# Patient Record
Sex: Female | Born: 1995 | Race: Black or African American | Hispanic: No | Marital: Single | Smoking: Never smoker
Health system: Southern US, Community
[De-identification: ages and names within clinical notes are randomized; demographics above are authoritative.]

## PROBLEM LIST (undated history)

## (undated) ENCOUNTER — Inpatient Hospital Stay (HOSPITAL_COMMUNITY): Payer: Self-pay

## (undated) ENCOUNTER — Ambulatory Visit (HOSPITAL_COMMUNITY): Source: Home / Self Care

## (undated) DIAGNOSIS — F419 Anxiety disorder, unspecified: Secondary | ICD-10-CM

## (undated) DIAGNOSIS — O24419 Gestational diabetes mellitus in pregnancy, unspecified control: Secondary | ICD-10-CM

## (undated) DIAGNOSIS — N390 Urinary tract infection, site not specified: Secondary | ICD-10-CM

## (undated) DIAGNOSIS — A599 Trichomoniasis, unspecified: Secondary | ICD-10-CM

## (undated) DIAGNOSIS — N83209 Unspecified ovarian cyst, unspecified side: Secondary | ICD-10-CM

## (undated) HISTORY — PX: NO PAST SURGERIES: SHX2092

---
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
## 2020-09-08 ENCOUNTER — Emergency Department (HOSPITAL_COMMUNITY)
Admission: EM | Admit: 2020-09-08 | Discharge: 2020-09-09 | Disposition: A | Discharge status: Home / Self Care | Payer: Medicaid Other | Attending: Emergency Medicine | Admitting: Emergency Medicine

## 2020-09-08 ENCOUNTER — Encounter (HOSPITAL_COMMUNITY): Payer: Self-pay

## 2020-09-08 DIAGNOSIS — Z036 Encounter for observation for suspected toxic effect from ingested substance ruled out: Secondary | ICD-10-CM | POA: Diagnosis not present

## 2020-09-08 DIAGNOSIS — Z5321 Procedure and treatment not carried out due to patient leaving prior to being seen by health care provider: Secondary | ICD-10-CM | POA: Insufficient documentation

## 2020-09-08 NOTE — ED Notes (Signed)
Pt up to desk inquiring about the wait time. Pt states that she will not be staying. Seen leaving ED.

## 2020-09-08 NOTE — ED Triage Notes (Signed)
Pt states that she was eating some food today and saw a worm in her food. Denies abd pain or n/v

## 2020-09-08 NOTE — ED Provider Notes (Signed)
Emergency Medicine Provider Triage Evaluation Note  Priscilla Phillips , a 25 y.o. female  was evaluated in triage.  Pt complains of eating food with worms in it.  Pt reports she ate around 7pm.  Pt reports her Mother wanted her to come get checked out.   Review of Systems  Positive:  Negative: No abdominal pain   Physical Exam  BP 126/76   Pulse 85   Temp 98.6 F (37 C) (Oral)   Resp 18   SpO2 100%  Gen:   Awake, no distress   Resp:  Normal effort  MSK:   Moves extremities without difficulty  Other:    Medical Decision Making  Medically screening exam initiated at 9:11 PM.  Appropriate orders placed.  Priscilla Phillips was informed that the remainder of the evaluation will be completed by another provider, this initial triage assessment does not replace that evaluation, and the importance of remaining in the ED until their evaluation is complete.     Priscilla Phillips 09/08/20 2112    Priscilla Sprout, MD 09/11/20 1352

## 2020-11-23 ENCOUNTER — Other Ambulatory Visit: Payer: Self-pay

## 2020-11-23 ENCOUNTER — Ambulatory Visit
Admission: EM | Admit: 2020-11-23 | Discharge: 2020-11-23 | Disposition: A | Discharge status: Home / Self Care | Payer: Medicaid Other | Attending: Physician Assistant | Admitting: Physician Assistant

## 2020-11-23 ENCOUNTER — Encounter: Payer: Self-pay | Admitting: Emergency Medicine

## 2020-11-23 DIAGNOSIS — R1030 Lower abdominal pain, unspecified: Secondary | ICD-10-CM

## 2020-11-23 HISTORY — DX: Unspecified ovarian cyst, unspecified side: N83.209

## 2020-11-23 LAB — POCT URINALYSIS DIP (MANUAL ENTRY)
Bilirubin, UA: NEGATIVE
Glucose, UA: NEGATIVE mg/dL
Leukocytes, UA: NEGATIVE
Nitrite, UA: NEGATIVE
Protein Ur, POC: NEGATIVE mg/dL
Spec Grav, UA: 1.025 (ref 1.010–1.025)
Urobilinogen, UA: 0.2 E.U./dL
pH, UA: 6.5 (ref 5.0–8.0)

## 2020-11-23 LAB — POCT URINE PREGNANCY: Preg Test, Ur: NEGATIVE

## 2020-11-23 NOTE — ED Triage Notes (Signed)
RLQ abdominal pain beginning two days prior following sexual intercourse, continuing into today. Moved into lower back yesterday. Denies dysuria, abnormal vaginal discharge, fever, N/V/D. Reports normal bowel movements. States is possible for her to be pregnant. Hx of ovarian cysts. Reports pain worse with walking. Abdominal tenderness present on palpation

## 2020-11-23 NOTE — ED Provider Notes (Signed)
[facility]    CSN: [id number] Arrival date & time: 11/23/20  1747      History   Chief Complaint Chief Complaint  Patient presents with   Abdominal Pain    HPI ELIM PEALE is a 25 y.o. female.   Pt complains of lower abdominal pain that started two days ago.  She reports some right lower back pain.  She denies dysuria, hematuria, vaginal discharge, pelvic pain, fever, chills, n/v/d. LMP 11/10/20. H/o painful ovarian cyst in the past. She has tried nothing for the sx.    Past Medical History:  Diagnosis Date   Ovarian cyst     There are no problems to display for this patient.   History reviewed. No pertinent surgical history.  OB History   No obstetric history on file.      Home Medications    Prior to Admission medications   Not on File    Family History History reviewed. No pertinent family history.  Social History Social History   Tobacco Use   Smoking status: Some Days    Types: Cigarettes   Smokeless tobacco: Never     Allergies   Patient has no known allergies.   Review of Systems Review of Systems  Constitutional:  Negative for chills and fever.  HENT:  Negative for ear pain and sore throat.   Eyes:  Negative for pain and visual disturbance.  Respiratory:  Negative for cough and shortness of breath.   Cardiovascular:  Negative for chest pain and palpitations.  Gastrointestinal:  Positive for abdominal pain. Negative for constipation, diarrhea, nausea and vomiting.  Genitourinary:  Negative for difficulty urinating, dysuria, hematuria, urgency, vaginal discharge and vaginal pain.  Musculoskeletal:  Negative for arthralgias and back pain.  Skin:  Negative for color change and rash.  Neurological:  Negative for seizures and syncope.  All other systems reviewed and are negative.   Physical Exam Triage Vital Signs ED Triage Vitals  Enc Vitals Group     BP 11/23/20 1839 126/83     Pulse Rate 11/23/20 1839 90      Resp 11/23/20 1839 16     Temp 11/23/20 1839 98.1 F (36.7 C)     Temp Source 11/23/20 1839 Oral     SpO2 11/23/20 1839 97 %     Weight --      Height --      Head Circumference --      Peak Flow --      Pain Score 11/23/20 1846 8     Pain Loc --      Pain Edu? --      Excl. in GC? --    No data found.  Updated Vital Signs BP 126/83 (BP Location: Left Arm)   Pulse 90   Temp 98.1 F (36.7 C) (Oral)   Resp 16   SpO2 97%   Visual Acuity Right Eye Distance:   Left Eye Distance:   Bilateral Distance:    Right Eye Near:   Left Eye Near:    Bilateral Near:     Physical Exam Vitals and nursing note reviewed.  Constitutional:      General: She is not in acute distress.    Appearance: She is well-developed.  HENT:     Head: Normocephalic and atraumatic.  Eyes:     Conjunctiva/sclera: Conjunctivae normal.  Cardiovascular:     Rate and Rhythm: Normal rate and regular rhythm.     Heart sounds: No murmur heard.  Pulmonary:     Effort: Pulmonary effort is normal. No respiratory distress.     Breath sounds: Normal breath sounds.  Abdominal:     Palpations: Abdomen is soft.     Tenderness: There is abdominal tenderness in the suprapubic area. There is no right CVA tenderness or left CVA tenderness. Negative signs include Murphy's sign and McBurney's sign.  Musculoskeletal:     Cervical back: Neck supple.  Skin:    General: Skin is warm and dry.  Neurological:     Mental Status: She is alert.     UC Treatments / Results  Labs (all labs ordered are listed, but only abnormal results are displayed) Labs Reviewed  POCT URINALYSIS DIP (MANUAL ENTRY) - Abnormal; Notable for the following components:      Result Value   Clarity, UA hazy (*)    Ketones, POC UA >= (160) (*)    Blood, UA trace-intact (*)    All other components within normal limits  POCT URINE PREGNANCY    EKG   Radiology No results found.  Procedures Procedures (including critical care  time)  Medications Ordered in UC Medications - No data to display  Initial Impression / Assessment and Plan / UC Course  I have reviewed the triage vital signs and the nursing notes.  Pertinent labs & imaging results that were available during my care of the patient were reviewed by me and considered in my medical decision making (see chart for details).     Pt with mild tenderness to deep palpation to suprapubic area, no RLQ tenderness on exam, no rebound tenderness, no CVA tenderness.  Vitals wnl, no n/v.  Stable for discharge with close monitoring at home.  Advised to go to the emergency department if sx become worse or if she develops n/v fever.  Final Clinical Impressions(s) / UC Diagnoses   Final diagnoses:  Lower abdominal pain     Discharge Instructions      Drink plenty of fluids Take ibuprofen as needed for pain If symptoms worsen go to the emergency department for further evaluation   ED Prescriptions   None    PDMP not reviewed this encounter.   Ward, Tylene Fantasia, PA-C 11/24/20 0930

## 2020-11-23 NOTE — Discharge Instructions (Addendum)
Drink plenty of fluids Take ibuprofen as needed for pain If symptoms worsen go to the emergency department for further evaluation

## 2020-12-07 ENCOUNTER — Inpatient Hospital Stay (HOSPITAL_COMMUNITY)
Admission: AD | Admit: 2020-12-07 | Discharge: 2020-12-07 | Disposition: A | Discharge status: Home / Self Care | Payer: Medicaid Other | Attending: Obstetrics and Gynecology | Admitting: Obstetrics and Gynecology

## 2020-12-07 ENCOUNTER — Encounter (HOSPITAL_COMMUNITY): Payer: Self-pay | Admitting: Obstetrics and Gynecology

## 2020-12-07 ENCOUNTER — Inpatient Hospital Stay (HOSPITAL_COMMUNITY): Payer: Medicaid Other

## 2020-12-07 ENCOUNTER — Other Ambulatory Visit: Payer: Self-pay

## 2020-12-07 DIAGNOSIS — O3680X0 Pregnancy with inconclusive fetal viability, not applicable or unspecified: Secondary | ICD-10-CM | POA: Diagnosis not present

## 2020-12-07 DIAGNOSIS — Z3A01 Less than 8 weeks gestation of pregnancy: Secondary | ICD-10-CM | POA: Diagnosis not present

## 2020-12-07 DIAGNOSIS — O26891 Other specified pregnancy related conditions, first trimester: Secondary | ICD-10-CM | POA: Diagnosis not present

## 2020-12-07 DIAGNOSIS — R102 Pelvic and perineal pain: Secondary | ICD-10-CM | POA: Insufficient documentation

## 2020-12-07 DIAGNOSIS — Z6790 Unspecified blood type, Rh positive: Secondary | ICD-10-CM

## 2020-12-07 HISTORY — DX: Anxiety disorder, unspecified: F41.9

## 2020-12-07 HISTORY — DX: Trichomoniasis, unspecified: A59.9

## 2020-12-07 HISTORY — DX: Urinary tract infection, site not specified: N39.0

## 2020-12-07 LAB — COMPREHENSIVE METABOLIC PANEL
ALT: 12 U/L (ref 0–44)
AST: 16 U/L (ref 15–41)
Albumin: 4.4 g/dL (ref 3.5–5.0)
Alkaline Phosphatase: 37 U/L — ABNORMAL LOW (ref 38–126)
Anion gap: 12 (ref 5–15)
BUN: 8 mg/dL (ref 6–20)
CO2: 21 mmol/L — ABNORMAL LOW (ref 22–32)
Calcium: 9.6 mg/dL (ref 8.9–10.3)
Chloride: 102 mmol/L (ref 98–111)
Creatinine, Ser: 0.65 mg/dL (ref 0.44–1.00)
GFR, Estimated: >60 mL/min (ref >60)
Glucose, Bld: 78 mg/dL (ref 70–99)
Potassium: 3.7 mmol/L (ref 3.5–5.1)
Sodium: 135 mmol/L (ref 135–145)
Total Bilirubin: 1.2 mg/dL (ref 0.3–1.2)
Total Protein: 8.1 g/dL (ref 6.5–8.1)

## 2020-12-07 LAB — WET PREP, GENITAL
Clue Cells Wet Prep HPF POC: NONE SEEN
Sperm: NONE SEEN
Trich, Wet Prep: NONE SEEN
WBC, Wet Prep HPF POC: NONE SEEN
Yeast Wet Prep HPF POC: NONE SEEN

## 2020-12-07 LAB — CBC WITH DIFFERENTIAL/PLATELET
Abs Immature Granulocytes: 0.02 10*3/uL (ref 0.00–0.07)
Basophils Absolute: 0 10*3/uL (ref 0.0–0.1)
Basophils Relative: 1 %
Eosinophils Absolute: 0.1 10*3/uL (ref 0.0–0.5)
Eosinophils Relative: 1 %
HCT: 44.5 % (ref 36.0–46.0)
Hemoglobin: 14.9 g/dL (ref 12.0–15.0)
Immature Granulocytes: 0 %
Lymphocytes Relative: 25 %
Lymphs Abs: 2.1 10*3/uL (ref 0.7–4.0)
MCH: 30.6 pg (ref 26.0–34.0)
MCHC: 33.5 g/dL (ref 30.0–36.0)
MCV: 91.4 fL (ref 80.0–100.0)
Monocytes Absolute: 0.6 10*3/uL (ref 0.1–1.0)
Monocytes Relative: 7 %
Neutro Abs: 5.5 10*3/uL (ref 1.7–7.7)
Neutrophils Relative %: 66 %
Platelets: 247 10*3/uL (ref 150–400)
RBC: 4.87 MIL/uL (ref 3.87–5.11)
RDW: 11.9 % (ref 11.5–15.5)
WBC: 8.2 10*3/uL (ref 4.0–10.5)
nRBC: 0 % (ref 0.0–0.2)

## 2020-12-07 LAB — URINALYSIS, ROUTINE W REFLEX MICROSCOPIC
Bilirubin Urine: NEGATIVE
Glucose, UA: NEGATIVE mg/dL
Hgb urine dipstick: NEGATIVE
Ketones, ur: NEGATIVE mg/dL
Leukocytes,Ua: NEGATIVE
Nitrite: NEGATIVE
Protein, ur: NEGATIVE mg/dL
Specific Gravity, Urine: 1.017 (ref 1.005–1.030)
pH: 7 (ref 5.0–8.0)

## 2020-12-07 LAB — POCT PREGNANCY, URINE: Preg Test, Ur: POSITIVE — AB

## 2020-12-07 LAB — ABO/RH: ABO/RH(D): A POS

## 2020-12-07 LAB — HCG, QUANTITATIVE, PREGNANCY: hCG, Beta Chain, Quant, S: 419 m[IU]/mL — ABNORMAL HIGH (ref <5)

## 2020-12-07 LAB — OB RESULTS CONSOLE GC/CHLAMYDIA: Gonorrhea: NEGATIVE

## 2020-12-07 NOTE — MAU Note (Signed)
3 +HPT on Friday.  Came so she can get a check up and a verification letter. Having so cramps. No bleeding.

## 2020-12-07 NOTE — Discharge Instructions (Signed)
Safe Medications in Pregnancy    Acne: Benzoyl Peroxide Salicylic Acid  Backache/Headache: Tylenol: 2 regular strength every 4 hours OR              2 Extra strength every 6 hours  Colds/Coughs/Allergies: Benadryl (alcohol free) 25 mg every 6 hours as needed Breath right strips Claritin Cepacol throat lozenges Chloraseptic throat spray Cold-Eeze- up to three times per day Cough drops, alcohol free Flonase (by prescription only) Guaifenesin Mucinex Robitussin DM (plain only, alcohol free) Saline nasal spray/drops Sudafed (pseudoephedrine) & Actifed ** use only after [redacted] weeks gestation and if you do not have high blood pressure Tylenol Vicks Vaporub Zinc lozenges Zyrtec   Constipation: Colace Ducolax suppositories Fleet enema Glycerin suppositories Metamucil Milk of magnesia Miralax Senokot Smooth move tea  Diarrhea: Kaopectate Imodium A-D  *NO pepto Bismol  Hemorrhoids: Anusol Anusol HC Preparation H Tucks  Indigestion: Tums Maalox Mylanta Zantac  Pepcid  Insomnia: Benadryl (alcohol free) 25mg every 6 hours as needed Tylenol PM Unisom, no Gelcaps  Leg Cramps: Tums MagGel  Nausea/Vomiting:  Bonine Dramamine Emetrol Ginger extract Sea bands Meclizine  Nausea medication to take during pregnancy:  Unisom (doxylamine succinate 25 mg tablets) Take one tablet daily at bedtime. If symptoms are not adequately controlled, the dose can be increased to a maximum recommended dose of two tablets daily (1/2 tablet in the morning, 1/2 tablet mid-afternoon and one at bedtime). Vitamin B6 100mg tablets. Take one tablet twice a day (up to 200 mg per day).  Skin Rashes: Aveeno products Benadryl cream or 25mg every 6 hours as needed Calamine Lotion 1% cortisone cream  Yeast infection: Gyne-lotrimin 7 Monistat 7   **If taking multiple medications, please check labels to avoid duplicating the same active ingredients **take  medication as directed on the label ** Do not exceed 4000 mg of tylenol in 24 hours **Do not take medications that contain aspirin or ibuprofen         Prenatal Care Providers           [facility] - accepts patients without insurance  Phone: [phone]  [facility] @ [facility]   Phone: [phone]  [facility]       Phone: [phone]            [facility] @ [place]     Phone: [phone]          [facility] @ [place]   Phone: [phone]  [facility] @ [facility] - accepts patients without insurance  Phone: [phone]  [facility] @ [facility] Phone: [phone]     [facility] - accepts patients without insurance Phone: [phone]  [facility]  Phone: [phone]  [facility] Phone: [phone]  [facility] Phone: [phone]  [facility] Phone: [phone]   [facility] Phone: [phone]  [facility]  Phone: [phone]  

## 2020-12-07 NOTE — MAU Provider Note (Signed)
History     CSN: [id number]  Arrival date and time: 12/07/20 1346   Event Date/Time   First Provider Initiated Contact with Patient 12/07/20 1504      Chief Complaint  Patient presents with   Abdominal Cramping   Ms. Priscilla Phillips is a 25 y.o. G2P0010 at [redacted]w[redacted]d who presents to MAU for mild pelvic cramping.  Pt denies VB, LOF, ctx, decreased FM, vaginal discharge/odor/itching. Pt denies N/V, abdominal pain, constipation, diarrhea, or urinary problems. Pt denies fever, chills, fatigue, sweating or changes in appetite. Pt denies SOB or chest pain. Pt denies dizziness, HA, light-headedness, weakness.   OB History     Gravida  2   Para      Term      Preterm      AB  1   Living         SAB  1   IAB      Ectopic      Multiple      Live Births              Past Medical History:  Diagnosis Date   Anxiety    Ovarian cyst    Trichomonas infection    UTI (urinary tract infection)     Past Surgical History:  Procedure Laterality Date   NO PAST SURGERIES      Family History  Problem Relation Age of Onset   Cancer Mother        skin   Healthy Father     Social History   Tobacco Use   Smoking status: Never   Smokeless tobacco: Never  Vaping Use   Vaping Use: Former  Substance Use Topics   Alcohol use: Never   Drug use: Not Currently    Types: Marijuana    Comment: July 2022    Allergies: No Known Allergies  No medications prior to admission.    Review of Systems  Constitutional:  Negative for chills, diaphoresis, fatigue and fever.  Eyes:  Negative for visual disturbance.  Respiratory:  Negative for shortness of breath.   Cardiovascular:  Negative for chest pain.  Gastrointestinal:  Negative for abdominal pain, constipation, diarrhea, nausea and vomiting.  Genitourinary:  Positive for pelvic pain. Negative for dysuria, flank pain, frequency, urgency, vaginal bleeding and vaginal discharge.  Neurological:  Negative for dizziness,  weakness, light-headedness and headaches.   Physical Exam   Blood pressure 120/78, temperature 98.4 F (36.9 C), temperature source Oral, resp. rate 16, height 4\' 11"  (1.499 m), weight 52.2 kg, last menstrual period 11/10/2020, SpO2 99 %.  Patient Vitals for the past 24 hrs:  BP Temp Temp src Resp SpO2 Height Weight  12/07/20 1413 120/78 98.4 F (36.9 C) Oral 16 99 % 4\' 11"  (1.499 m) 52.2 kg   Physical Exam Vitals and nursing note reviewed.  Constitutional:      General: She is not in acute distress.    Appearance: Normal appearance. She is not ill-appearing, toxic-appearing or diaphoretic.  HENT:     Head: Normocephalic and atraumatic.  Pulmonary:     Effort: Pulmonary effort is normal.  Neurological:     Mental Status: She is alert and oriented to person, place, and time.  Psychiatric:        Mood and Affect: Mood normal.        Behavior: Behavior normal.        Thought Content: Thought content normal.        Judgment: Judgment normal.  Results for orders placed or performed during the hospital encounter of 12/07/20 (from the past 24 hour(s))  Pregnancy, urine POC     Status: Abnormal   Collection Time: 12/07/20  2:23 PM  Result Value Ref Range   Preg Test, Ur POSITIVE (A) NEGATIVE  hCG, quantitative, pregnancy     Status: Abnormal   Collection Time: 12/07/20  2:39 PM  Result Value Ref Range   hCG, Beta Chain, Quant, S 419 (H) <5 mIU/mL  ABO/Rh     Status: None   Collection Time: 12/07/20  2:39 PM  Result Value Ref Range   ABO/RH(D) A POS    No rh immune globuloin      NOT A RH IMMUNE GLOBULIN CANDIDATE, PT RH POSITIVE Performed at [facility], [address], [place], [place] [address]   CBC with Differential/Platelet     Status: None   Collection Time: 12/07/20  3:12 PM  Result Value Ref Range   WBC 8.2 4.0 - 10.5 K/uL   RBC 4.87 3.87 - 5.11 MIL/uL   Hemoglobin 14.9 12.0 - 15.0 g/dL   HCT 75.6 43.3 - 29.5 %   MCV 91.4 80.0 - 100.0 fL   MCH 30.6 26.0 -  34.0 pg   MCHC 33.5 30.0 - 36.0 g/dL   RDW 18.8 41.6 - 60.6 %   Platelets 247 150 - 400 K/uL   nRBC 0.0 0.0 - 0.2 %   Neutrophils Relative % 66 %   Neutro Abs 5.5 1.7 - 7.7 K/uL   Lymphocytes Relative 25 %   Lymphs Abs 2.1 0.7 - 4.0 K/uL   Monocytes Relative 7 %   Monocytes Absolute 0.6 0.1 - 1.0 K/uL   Eosinophils Relative 1 %   Eosinophils Absolute 0.1 0.0 - 0.5 K/uL   Basophils Relative 1 %   Basophils Absolute 0.0 0.0 - 0.1 K/uL   Immature Granulocytes 0 %   Abs Immature Granulocytes 0.02 0.00 - 0.07 K/uL  Comprehensive metabolic panel     Status: Abnormal   Collection Time: 12/07/20  3:12 PM  Result Value Ref Range   Sodium 135 135 - 145 mmol/L   Potassium 3.7 3.5 - 5.1 mmol/L   Chloride 102 98 - 111 mmol/L   CO2 21 (L) 22 - 32 mmol/L   Glucose, Bld 78 70 - 99 mg/dL   BUN 8 6 - 20 mg/dL   Creatinine, Ser 3.01 0.44 - 1.00 mg/dL   Calcium 9.6 8.9 - 60.1 mg/dL   Total Protein 8.1 6.5 - 8.1 g/dL   Albumin 4.4 3.5 - 5.0 g/dL   AST 16 15 - 41 U/L   ALT 12 0 - 44 U/L   Alkaline Phosphatase 37 (L) 38 - 126 U/L   Total Bilirubin 1.2 0.3 - 1.2 mg/dL   GFR, Estimated >09 >32 mL/min   Anion gap 12 5 - 15  Wet prep, genital     Status: None   Collection Time: 12/07/20  3:30 PM   Specimen: Vaginal  Result Value Ref Range   Yeast Wet Prep HPF POC NONE SEEN NONE SEEN   Trich, Wet Prep NONE SEEN NONE SEEN   Clue Cells Wet Prep HPF POC NONE SEEN NONE SEEN   WBC, Wet Prep HPF POC NONE SEEN NONE SEEN   Sperm NONE SEEN    US OB LESS THAN 14 WEEKS WITH OB TRANSVAGINAL  Result Date: 12/07/2020 CLINICAL DATA:  Cramping EXAM: OBSTETRIC <14 WK [place] AND TRANSVAGINAL OB US  TECHNIQUE: Both transabdominal and transvaginal ultrasound examinations were performed for complete evaluation of the gestation as well as the maternal uterus, adnexal regions, and pelvic cul-de-sac. Transvaginal technique was performed to assess early pregnancy. COMPARISON:  None. FINDINGS: Intrauterine gestational sac:  None Yolk sac:  Not Visualized. Embryo:  Not Visualized. Cardiac Activity: Not Visualized. Heart Rate:   bpm MSD:   mm    w     d CRL:    mm    w    d                  US EDC: Subchorionic hemorrhage:  None visualized. Maternal uterus/adnexae: No adnexal mass or free fluid. IMPRESSION: No intrauterine pregnancy visualized. Differential considerations would include early intrauterine pregnancy too early to visualize, spontaneous abortion, or occult ectopic pregnancy. Recommend close clinical followup and serial quantitative beta HCGs and ultrasounds. Electronically Signed   By: Charlett NoseKevin  Dover M.D.   On: 12/07/2020 16:27    MAU Course  Procedures  MDM -r/o ectopic -UA: pending at time of discharge, sending urine for culture based on symptoms -CBC: WNL -CMP: no abnormalities requiring treatment -US: PUL -hCG: 419 -ABO: A + -WetPrep: WNL -GC/CT collected -Discussed with client the diagnosis of pregnancy of unknown anatomic location.  Three possibilities of outcome are: a healthy pregnancy that is too early to see a yolk sac to confirm the pregnancy is in the uterus, a pregnancy that is not healthy and has not developed and will not develop, and an ectopic pregnancy that is in the abdomen that cannot be identified at this time.  And ectopic pregnancy can be a life threatening situation as a pregnancy needs to be in the uterus which is a muscle and can stretch to accommodate the growth of a pregnancy.  Other structures in the pelvis and abdomen as not muscular and do not stretch with the growth of a pregnancy.  Worst case scenario is that a structure ruptures with a growing pregnancy not in the uterus and and internal hemorrhage can be a life threatening situation.  We need to follow the progression of this pregnancy carefully.  We need to check another serum pregnancy hormone level to determine if the levels are rising appropriately  and to determine the next steps that are needed for you. Patient's  questions were answered. -pt discharged to home in stable condition  Orders Placed This Encounter  Procedures   Wet prep, genital    Standing Status:   Standing    Number of Occurrences:   1   Culture, OB Urine    Standing Status:   Standing    Number of Occurrences:   1   US OB LESS THAN 14 WEEKS WITH OB TRANSVAGINAL    Standing Status:   Standing    Number of Occurrences:   1    Order Specific Question:   Symptom/Reason for Exam    Answer:   Pelvic cramping [[id number]][[id number]]   Urinalysis, Routine w reflex microscopic    Standing Status:   Standing    Number of Occurrences:   1   CBC with Differential/Platelet    Standing Status:   Standing    Number of Occurrences:   1   Comprehensive metabolic panel    Standing Status:   Standing    Number of Occurrences:   1   hCG, quantitative, pregnancy    Standing Status:   Standing    Number of Occurrences:   1  Pregnancy, urine POC    Standing Status:   Standing    Number of Occurrences:   1   ABO/Rh    Standing Status:   Standing    Number of Occurrences:   1   Discharge patient    Order Specific Question:   Discharge disposition    Answer:   01-Home or Self Care [1]    Order Specific Question:   Discharge patient date    Answer:   12/07/2020   No orders of the defined types were placed in this encounter.  Assessment and Plan   1. Pregnancy of unknown anatomic location   2. Pelvic cramping   3. Blood type, Rh positive     Allergies as of 12/07/2020   No Known Allergies      Medication List    You have not been prescribed any medications.    -will call with culture results, if positive -safe meds in pregnancy list given -list of OB providers given -discussed ectopic vs. SAB vs. miscarriage -strict ectopic precautions given -return MAU precautions -f/u on [facility] at 12/10/2020 for repeat hCG -pt discharged to home in stable condition  Joni Reining E Tanaya Dunigan 12/07/2020, 6:02 PM

## 2020-12-08 LAB — GC/CHLAMYDIA PROBE AMP (~~LOC~~) NOT AT ARMC
Chlamydia: NEGATIVE
Comment: NEGATIVE
Comment: NORMAL
Neisseria Gonorrhea: NEGATIVE

## 2020-12-09 LAB — CULTURE, OB URINE: Culture: <10000 — AB

## 2020-12-10 ENCOUNTER — Inpatient Hospital Stay (HOSPITAL_COMMUNITY)
Admission: AD | Admit: 2020-12-10 | Discharge: 2020-12-10 | Disposition: A | Discharge status: Home / Self Care | Payer: Medicaid Other | Attending: Obstetrics and Gynecology | Admitting: Obstetrics and Gynecology

## 2020-12-10 ENCOUNTER — Other Ambulatory Visit: Payer: Self-pay

## 2020-12-10 ENCOUNTER — Ambulatory Visit: Payer: Medicaid Other

## 2020-12-10 ENCOUNTER — Telehealth: Payer: Self-pay

## 2020-12-10 DIAGNOSIS — R109 Unspecified abdominal pain: Secondary | ICD-10-CM | POA: Diagnosis not present

## 2020-12-10 DIAGNOSIS — O3680X0 Pregnancy with inconclusive fetal viability, not applicable or unspecified: Secondary | ICD-10-CM | POA: Diagnosis present

## 2020-12-10 DIAGNOSIS — Z3A00 Weeks of gestation of pregnancy not specified: Secondary | ICD-10-CM | POA: Insufficient documentation

## 2020-12-10 DIAGNOSIS — O26899 Other specified pregnancy related conditions, unspecified trimester: Secondary | ICD-10-CM | POA: Diagnosis not present

## 2020-12-10 LAB — HCG, QUANTITATIVE, PREGNANCY: hCG, Beta Chain, Quant, S: 1985 m[IU]/mL — ABNORMAL HIGH (ref <5)

## 2020-12-10 MED ORDER — PRENATAL VITAMIN 27-0.8 MG PO TABS: 1.0000 | ORAL_TABLET | Freq: Every day | ORAL | 11 refills | Status: DC

## 2020-12-10 NOTE — Telephone Encounter (Signed)
Patient missed appt this AM for beta HCG. Per chart review, pt has presented to MAU for follow up this AM.

## 2020-12-10 NOTE — MAU Note (Signed)
Presents for HCG Level.  Denies pain or VB.

## 2020-12-10 NOTE — MAU Provider Note (Addendum)
None     S Ms. GUNDA MAQUEDA is a 25 y.o. G2P0010 patient who presents to MAU for hcg check.  Seen three days prior on 12/07/2020 for abdominal cramping. HCG was 419. No bleeding or other symptoms at that time.  Today reports ongoing cramping but no change from last visit. No vaginal bleeding. Does have a blister on her lip she's worried about.   O BP 135/75 (BP Location: Right Arm)   Pulse 98   Temp 98.6 F (37 C) (Oral)   Resp 20   Ht 4\' 10"  (1.473 m)   Wt 51.2 kg   LMP 11/10/2020   SpO2 100%   BMI 23.60 kg/m  Physical Exam Vitals reviewed.  Constitutional:      General: She is not in acute distress.    Appearance: She is well-developed. She is not diaphoretic.  Eyes:     General: No scleral icterus. Pulmonary:     Effort: Pulmonary effort is normal. No respiratory distress.  Skin:    General: Skin is warm and dry.  Neurological:     Mental Status: She is alert.     Coordination: Coordination normal.    A Medical screening exam complete Pregnancy of unknown location blister  P Repeat HCG obtained, patient prefers to be called with results.  Appears to have blister on lip, does not have herpetic appearance, reassured. Prenatal vitamins sent per her request. Discharge from MAU in stable condition Warning signs for worsening condition that would warrant emergency follow-up discussed Patient may return to MAU as needed   11/12/2020, MD 12/10/2020 9:45 AM     Addendum: Hcg with significant rise from 419>1,985. Called patient and identified by 2 markers. Discussed this is indicative of normal pregnancy. Recommended she call an office of her choice to start prenatal care. Will also order 2 week follow up viability scan. Reviewed ectopic precautions again with patient. All questions answered.  12/12/2020, MD/MPH Attending Family Medicine Physician, [facility] for [facility], [facility]  12/10/20 11:23  AM

## 2020-12-14 ENCOUNTER — Ambulatory Visit: Payer: Self-pay

## 2020-12-14 NOTE — Telephone Encounter (Signed)
Pt had 1 episode of spotting. She stated that it was a very small amount (penny sized).Denies abdominal pain, cramping,weakness or lightheaded. Pt stated she feels normal. No other sx.Care advice given and pt verbalized understanding.        Reason for Disposition  SPOTTING (single or brief episode)  Answer Assessment - Initial Assessment Questions 1. ONSET: "When did this bleeding start?"       today 2. DESCRIPTION: "Describe the bleeding that you are having." "How much bleeding is there?"    - SPOTTING: spotting, or pinkish / brownish mucous discharge; does not fill panty liner or pad    - MILD:  less than 1 pad / hour; less than patient's usual menstrual bleeding   - MODERATE: 1-2 pads / hour; 1 menstrual cup every 6 hours; small-medium blood clots (e.g., pea, grape, small coin)   - SEVERE: soaking 2 or more pads/hour for 2 or more hours; 1 menstrual cup every 2 hours; bleeding not contained by pads or continuous red blood from vagina; large blood clots (e.g., golf ball, large coin)      spotting 3. ABDOMINAL PAIN SEVERITY: If present, ask: "How bad is it?"  (e.g., Scale 1-10; mild, moderate, or severe)   - MILD (1-3): doesn't interfere with normal activities, abdomen soft and not tender to touch    - MODERATE (4-7): interferes with normal activities or awakens from sleep, abdomen tender to touch    - SEVERE (8-10): excruciating pain, doubled over, unable to do any normal activities     None  4. PREGNANCY: "Do you know how many weeks or months pregnant you are?" "When was the first day of your last normal menstrual period?"     4 weeks 6 days--LMP 11/10/20 5. HEMODYNAMIC STATUS: "Are you weak or feeling lightheaded?" If Yes, ask: "Can you stand and walk normally?"      no 6. OTHER SYMPTOMS: "What other symptoms are you having with the bleeding?" (e.g., passed tissue, vaginal discharge, fever, menstrual-type cramps)     no  Protocols used: Pregnancy - Vaginal Bleeding Less Than [redacted]  Weeks EGA-A-AH

## 2020-12-17 ENCOUNTER — Ambulatory Visit: Admission: RE | Admit: 2020-12-17 | Payer: Medicaid Other | Source: Ambulatory Visit

## 2020-12-24 ENCOUNTER — Ambulatory Visit
Admission: RE | Admit: 2020-12-24 | Discharge: 2020-12-24 | Disposition: A | Discharge status: Home / Self Care | Payer: Medicaid Other | Source: Ambulatory Visit | Attending: Family Medicine | Admitting: Family Medicine

## 2020-12-24 ENCOUNTER — Other Ambulatory Visit: Payer: Self-pay

## 2020-12-24 DIAGNOSIS — O3680X0 Pregnancy with inconclusive fetal viability, not applicable or unspecified: Secondary | ICD-10-CM | POA: Diagnosis present

## 2020-12-25 ENCOUNTER — Telehealth (HOSPITAL_BASED_OUTPATIENT_CLINIC_OR_DEPARTMENT_OTHER): Payer: Self-pay | Admitting: Medical

## 2020-12-25 NOTE — Telephone Encounter (Signed)
Returned patient call with results. Advised of normal [place] from yesterday due date and FHR. Patient is not yet established with prenatal care. Will refer to [facility] office for first visit in 4 weeks. First trimester warning signs reviewed. Patient voiced understanding.   US OB Transvaginal  Result Date: 12/24/2020 CLINICAL DATA:  Five bili deep. Estimated gestational age of [redacted] weeks, 2 days by LMP. EXAM: TRANSVAGINAL OB ULTRASOUND TECHNIQUE: Transvaginal ultrasound was performed for complete evaluation of the gestation as well as the maternal uterus, adnexal regions, and pelvic cul-de-sac. COMPARISON:  December 07, 2020. FINDINGS: Intrauterine gestational sac: Single. Yolk sac:  Visualized. Embryo:  Visualized. Cardiac Activity: Visualized. Heart Rate: 123 bpm CRL:   3.6 mm   6 w 0 d                  [place] EDC: 08/19/2021 Subchorionic hemorrhage:  None visualized. Maternal uterus/adnexae: Unremarkable.  Right ovarian corpus luteum. IMPRESSION: 1. Single live intrauterine pregnancy with estimated gestational age of [redacted] weeks, 0 days. Electronically Signed   By: Obie Dredge M.D.   On: 12/24/2020 15:00    Vonzella Nipple, PA-C 12/25/2020 9:03 AM

## 2020-12-25 NOTE — Telephone Encounter (Signed)
LM for patient to return call to my office at [facility] for results. [place] results from yesterday afternoon were normal. MyChart pending.   US OB Transvaginal  Result Date: 12/24/2020 CLINICAL DATA:  Five bili deep. Estimated gestational age of [redacted] weeks, 2 days by LMP. EXAM: TRANSVAGINAL OB ULTRASOUND TECHNIQUE: Transvaginal ultrasound was performed for complete evaluation of the gestation as well as the maternal uterus, adnexal regions, and pelvic cul-de-sac. COMPARISON:  December 07, 2020. FINDINGS: Intrauterine gestational sac: Single. Yolk sac:  Visualized. Embryo:  Visualized. Cardiac Activity: Visualized. Heart Rate: 123 bpm CRL:   3.6 mm   6 w 0 d                  [place] EDC: 08/19/2021 Subchorionic hemorrhage:  None visualized. Maternal uterus/adnexae: Unremarkable.  Right ovarian corpus luteum. IMPRESSION: 1. Single live intrauterine pregnancy with estimated gestational age of [redacted] weeks, 0 days. Electronically Signed   By: Obie Dredge M.D.   On: 12/24/2020 15:00    Vonzella Nipple, PA-C 12/25/2020 8:37 AM

## 2020-12-25 NOTE — Telephone Encounter (Signed)
Patient called and stated she returning your call about her result.

## 2020-12-28 ENCOUNTER — Telehealth: Payer: Self-pay | Admitting: Medical

## 2020-12-28 NOTE — Telephone Encounter (Signed)
Attempted to reach patient about an appointment. Left a message for her to call the office for an appointment.

## 2021-01-05 ENCOUNTER — Ambulatory Visit: Payer: Self-pay | Admitting: Nurse Practitioner

## 2021-02-01 ENCOUNTER — Ambulatory Visit (INDEPENDENT_AMBULATORY_CARE_PROVIDER_SITE_OTHER): Payer: Medicaid Other

## 2021-02-01 ENCOUNTER — Other Ambulatory Visit: Payer: Self-pay

## 2021-02-01 VITALS — BP 124/84 | HR 93 | Temp 98.9°F | Wt 116.8 lb

## 2021-02-01 DIAGNOSIS — Z3480 Encounter for supervision of other normal pregnancy, unspecified trimester: Secondary | ICD-10-CM | POA: Insufficient documentation

## 2021-02-01 DIAGNOSIS — Z3401 Encounter for supervision of normal first pregnancy, first trimester: Secondary | ICD-10-CM

## 2021-02-01 DIAGNOSIS — Z3A11 11 weeks gestation of pregnancy: Secondary | ICD-10-CM

## 2021-02-01 LAB — HEPATITIS C ANTIBODY: HCV Ab: NEGATIVE

## 2021-02-01 MED ORDER — BLOOD PRESSURE MONITOR AUTOMAT DEVI: 0 refills | Status: DC

## 2021-02-01 NOTE — Progress Notes (Signed)
    I discussed the limitations, risks, security and privacy concerns of performing an evaluation and management service by telephone and the availability of in person appointments. I also discussed with the patient that there may be a patient responsible charge related to this service. The patient expressed understanding and agreed to proceed.   Location: [facility] Patient: clinic Provider: clinic  PRENATAL INTAKE SUMMARY  Ms. Priscilla Phillips presents today New OB Nurse Interview.  OB History     Gravida  2   Para      Term      Preterm      AB  1   Living         SAB  1   IAB      Ectopic      Multiple      Live Births             I have reviewed the patient's medical, obstetrical, social, and family histories, medications, and available lab results.  SUBJECTIVE She has no unusual complaints  OBJECTIVE Initial Nurse interview for history/labs (New OB)  last menstrual period date: 11/10/2020 EDD: 08/17/2020   GA: [redacted]w[redacted]d G2P0010 FHT: 161  GENERAL APPEARANCE: alert, well appearing, in no apparent distress, oriented to person, place and time   ASSESSMENT Normal pregnancy  PLAN Prenatal care:  [facility] Pnl/HIV/Hep C OB Urine Culture GC/CT- to be done at next apt  PAPapproximate date 04/16/2019 and was normal HgbEval/SMA/CF ([facility]) Panorama A1C   Blood Pressure Monitor/Weight Scale BP Rx sent to [facility] Weight Scale pt prefers to get her own   MyChart/Babyscripts MyChart access verified. I explained pt will have some visits in office and some virtually. Babyscripts instructions given and order placed.   Placed OB Box on problem list and updated   Followup with Raelyn Mora, CNM on 02/10/2021  Follow Up Instructions:   I discussed the assessment and treatment plan with the patient. The patient was provided an opportunity to ask questions and all were answered. The patient agreed with the plan and demonstrated an  understanding of the instructions.   The patient was advised to call back or seek an in-person evaluation if the symptoms worsen or if the condition fails to improve as anticipated.  I provided 40 minutes of  face-to-face time during this encounter.  Gearldine Shown, CMA

## 2021-02-02 LAB — OBSTETRIC PANEL, INCLUDING HIV
Antibody Screen: NEGATIVE
Basophils Absolute: 0 10*3/uL (ref 0.0–0.2)
Basos: 0 %
EOS (ABSOLUTE): 0.1 10*3/uL (ref 0.0–0.4)
Eos: 1 %
HIV Screen 4th Generation wRfx: NONREACTIVE
Hematocrit: 37.4 % (ref 34.0–46.6)
Hemoglobin: 12.9 g/dL (ref 11.1–15.9)
Hepatitis B Surface Ag: NEGATIVE
Immature Grans (Abs): 0 10*3/uL (ref 0.0–0.1)
Immature Granulocytes: 0 %
Lymphocytes Absolute: 2.1 10*3/uL (ref 0.7–3.1)
Lymphs: 17 %
MCH: 30.1 pg (ref 26.6–33.0)
MCHC: 34.5 g/dL (ref 31.5–35.7)
MCV: 87 fL (ref 79–97)
Monocytes Absolute: 0.6 10*3/uL (ref 0.1–0.9)
Monocytes: 5 %
Neutrophils Absolute: 9.6 10*3/uL — ABNORMAL HIGH (ref 1.4–7.0)
Neutrophils: 77 %
Platelets: 258 10*3/uL (ref 150–450)
RBC: 4.28 x10E6/uL (ref 3.77–5.28)
RDW: 11.9 % (ref 11.7–15.4)
RPR Ser Ql: NONREACTIVE
Rh Factor: POSITIVE
Rubella Antibodies, IGG: 1.53 index (ref >0.99)
WBC: 12.4 10*3/uL — ABNORMAL HIGH (ref 3.4–10.8)

## 2021-02-03 LAB — HEPATITIS C ANTIBODY: Hep C Virus Ab: <0.1 s/co ratio (ref 0.0–0.9)

## 2021-02-03 LAB — HEMOGLOBIN A1C
Est. average glucose Bld gHb Est-mCnc: 100 mg/dL
Hgb A1c MFr Bld: 5.1 % (ref 4.8–5.6)

## 2021-02-04 LAB — URINE CULTURE, OB REFLEX

## 2021-02-04 LAB — CULTURE, OB URINE

## 2021-02-10 ENCOUNTER — Encounter: Payer: Medicaid Other | Admitting: Obstetrics and Gynecology

## 2021-02-11 ENCOUNTER — Telehealth: Payer: Self-pay

## 2021-02-11 NOTE — Telephone Encounter (Signed)
   Priscilla Phillips DOB: September 21, 1995 MRN: [id number]   RIDER WAIVER AND RELEASE OF LIABILITY  For purposes of improving physical access to our facilities, [facility] is pleased to partner with third parties to provide [facility] patients or other authorized individuals the option of convenient, on-demand ground transportation services (the [employer]") through use of the technology service that enables users to request on-demand ground transportation from independent third-party providers.  By opting to use and accept these [employer], I, the undersigned, hereby agree on behalf of myself, and on behalf of any minor child using the Science writer for whom I am the parent or legal guardian, as follows:  Science writer provided to me are provided by independent third-party transportation providers who are not [employer] or employees and who are unaffiliated with [employer]. [facility] is neither a transportation carrier nor a common or public carrier. [facility] has no control over the quality or safety of the transportation that occurs as a result of the [employer]. [facility] cannot guarantee that any third-party transportation provider will complete any arranged transportation service. [facility] makes no representation, warranty, or guarantee regarding the reliability, timeliness, quality, safety, suitability, or availability of any of the Transport Services or that they will be error free. I fully understand that traveling by vehicle involves risks and dangers of serious bodily injury, including permanent disability, paralysis, and death. I agree, on behalf of myself and on behalf of any minor child using the Transport Services for whom I am the parent or legal guardian, that the entire risk arising out of my use of the [employer] remains solely with me, to the maximum extent permitted under applicable law. The [employer] are provided  "as is" and "as available." [facility] disclaims all representations and warranties, express, implied or statutory, not expressly set out in these terms, including the implied warranties of merchantability and fitness for a particular purpose. I hereby waive and release [facility], its agents, employees, officers, directors, representatives, insurers, attorneys, assigns, successors, subsidiaries, and affiliates from any and all past, present, or future claims, demands, liabilities, actions, causes of action, or suits of any kind directly or indirectly arising from acceptance and use of the [employer]. I further waive and release [facility] and its affiliates from all present and future liability and responsibility for any injury or death to persons or damages to property caused by or related to the use of the [employer]. I have read this Waiver and Release of Liability, and I understand the terms used in it and their legal significance. This Waiver is freely and voluntarily given with the understanding that my right (as well as the right of any minor child for whom I am the parent or legal guardian using the [employer]) to legal recourse against [facility] in connection with the [employer] is knowingly surrendered in return for use of these services.   I attest that I read the consent document to [name], gave Ms. Vargus the opportunity to ask questions and answered the questions asked (if any). I affirm that [name] then provided consent for she's participation in this program.     Launa Grill

## 2021-02-12 ENCOUNTER — Ambulatory Visit (INDEPENDENT_AMBULATORY_CARE_PROVIDER_SITE_OTHER): Payer: Medicaid Other

## 2021-02-12 ENCOUNTER — Other Ambulatory Visit: Payer: Self-pay

## 2021-02-12 VITALS — BP 122/74 | HR 123 | Temp 97.9°F | Wt 118.0 lb

## 2021-02-12 DIAGNOSIS — Z3A13 13 weeks gestation of pregnancy: Secondary | ICD-10-CM

## 2021-02-12 DIAGNOSIS — Z3480 Encounter for supervision of other normal pregnancy, unspecified trimester: Secondary | ICD-10-CM

## 2021-02-12 DIAGNOSIS — Z8659 Personal history of other mental and behavioral disorders: Secondary | ICD-10-CM

## 2021-02-12 HISTORY — DX: Personal history of other mental and behavioral disorders: Z86.59

## 2021-02-12 NOTE — Progress Notes (Signed)
Subjective:   Priscilla Phillips is a 25 y.o. G2P0010 at [redacted]w[redacted]d by Definite LMP of Nov 10, 2020 being seen today for her first obstetrical visit.  Patient states this was a planned pregnancy.  Patient reports she was not on birth control prior to conception. She reports using depo in the past, but was not comfortable with amenorrhea.   Gynecological/Obstetrical History: Patient reports no history of gynecological surgeries.  Patient denies history of abnormal pap smears.   Pregnancy history fully reviewed. Patient does intend to breast feed as well as bottle feed. Patient obstetrical history is unremarkable.   Sexual Activity and Vaginal Concerns: Patient is currently sexually active and denies pain or discomfort during intercourse.  She also denies vaginal discharge, bleeding, irritation, or odor. Patient also denies pain or difficulty with urination.    Medical History/ROS: Patient denies medical history significant for cardiovascular, respiratory, gastrointestinal, or hematological disorders. Patient reports history of anxiety and is not taking anything currently. She denies history of MH disorders including anxiety and/or depression.  Patient reports no complaints.  Patient denies constipation/diarrhea or nausea/vomiting.  Patient reports having "light headaches  all the time." She reports improvement with increased water intake and the occasional nap.     Social History: Patient denies current usage of tobacco, alcohol, or drugs.  She reports history of MJ usage; twice daily.  Patient reports the FOB is Sallyanne Havers who is present, supportive, and involved.  Patient reports that she lives with her sister and endorses safety at home.  Patient denies DV/A. Patient is currently employed at [employer].  HISTORY: OB History  Gravida Para Term Preterm AB Living  2 0 0 0 1 0  SAB IAB Ectopic Multiple Live Births  1 0 0 0 0    # Outcome Date GA Lbr Len/2nd Weight Sex Delivery Anes PTL Lv  2  Current           1 SAB 05/2018 [redacted]w[redacted]d           Last pap smear was done Jan 2021 and was normal  Past Medical History:  Diagnosis Date   Anxiety    Ovarian cyst    Trichomonas infection    UTI (urinary tract infection)    Past Surgical History:  Procedure Laterality Date   NO PAST SURGERIES     Family History  Problem Relation Age of Onset   Depression Mother    Cancer Mother        skin   Healthy Father    Diabetes Maternal Grandmother    Hypertension Maternal Grandmother    Social History   Tobacco Use   Smoking status: Never   Smokeless tobacco: Never  Vaping Use   Vaping Use: Former  Substance Use Topics   Alcohol use: Never   Drug use: Not Currently    Types: Marijuana    Comment: July 2022   No Known Allergies Current Outpatient Medications on File Prior to Visit  Medication Sig Dispense Refill   Blood Pressure Monitoring (BLOOD PRESSURE MONITOR AUTOMAT) DEVI To monitor blood pressure as needed Z34.90 1 each 0   Prenatal Vit-Fe Fumarate-FA (PRENATAL VITAMIN) 27-0.8 MG TABS Take 1 tablet by mouth daily. 30 tablet 11   No current facility-administered medications on file prior to visit.    Review of Systems Pertinent items noted in HPI and remainder of comprehensive ROS otherwise negative.  Exam   Vitals:   02/12/21 1056  BP: 122/74  Pulse: Marland Kitchen)  123  Temp: 97.9 F (36.6 C)  Weight: 118 lb (53.5 kg)      OBGyn Exam  Assessment:   25 y.o. year old G2P0010 Patient Active Problem List   Diagnosis Date Noted   History of anxiety 02/12/2021   Supervision of other normal pregnancy, antepartum 02/01/2021     Plan:  1. Supervision of other normal pregnancy, antepartum -Congratulations given and patient welcomed to practice. -Discussed usage of Babyscripts and virtual visits as additional source of managing and completing PN visits.   *Instructed to take blood pressure and record weekly into babyscripts. *Reviewed prenatal visit schedule and  platforms used for virtual visits.  -Anticipatory guidance for prenatal visits including labs, ultrasounds, and testing; Initial labs reviewed. -Genetic Screening discussed, First trimester screen, Quad screen, and NIPS: declined. -Encouraged to complete and utilize MyChart Registration for her ability to review results, send requests, and have questions addressed.  -Discussed estimated due date of Aug 17, 2021. -Ultrasound discussed; fetal anatomic survey: ordered. -Continue prenatal vitamins  -Influenza offered and declined. -Encouraged to seek out care at office or emergency room for urgent and/or emergent concerns. -Educated on the nature of [facility] with multiple MDs and other Advanced Practice Providers was explained to patient; also emphasized that residents, students are part of our team. Informed of her right to refuse care as she deems appropriate.  -No questions or concerns.    2. [redacted] weeks gestation of pregnancy -Doing well. -Reassured that okay to appear larger than GA, fundus remains in appropriate place. -Further reassured that [place] will confirm dates.  -Discussed taking CB class in future for education on what to expect in labor, delivery, and initial postpartum period. -Anticipatory guidance for upcoming appts. -Patient to schedule next appt in 5 weeks for a virtual visit. -Discussed preparations for virtual visit including taking blood pressure and mychart usage.     3. History of anxiety -No current medication -Referral placed for IBH   Problem list reviewed and updated. Routine obstetric precautions reviewed.  No orders of the defined types were placed in this encounter.   No follow-ups on file.   Cherre Robins, CNM 02/12/2021 11:22 AM

## 2021-02-14 ENCOUNTER — Other Ambulatory Visit: Payer: Self-pay | Admitting: Women's Health

## 2021-02-14 ENCOUNTER — Ambulatory Visit
Admission: EM | Admit: 2021-02-14 | Discharge: 2021-02-14 | Disposition: A | Discharge status: Home / Self Care | Payer: Medicaid Other

## 2021-02-14 ENCOUNTER — Telehealth: Payer: Self-pay | Admitting: Women's Health

## 2021-02-14 ENCOUNTER — Other Ambulatory Visit: Payer: Self-pay

## 2021-02-14 ENCOUNTER — Inpatient Hospital Stay (HOSPITAL_COMMUNITY)
Admission: AD | Admit: 2021-02-14 | Discharge: 2021-02-14 | Disposition: A | Discharge status: Home / Self Care | Payer: Medicaid Other | Attending: Obstetrics & Gynecology | Admitting: Obstetrics & Gynecology

## 2021-02-14 ENCOUNTER — Encounter: Payer: Self-pay | Admitting: Women's Health

## 2021-02-14 DIAGNOSIS — O26891 Other specified pregnancy related conditions, first trimester: Secondary | ICD-10-CM

## 2021-02-14 DIAGNOSIS — O26892 Other specified pregnancy related conditions, second trimester: Secondary | ICD-10-CM | POA: Diagnosis present

## 2021-02-14 DIAGNOSIS — Z3A13 13 weeks gestation of pregnancy: Secondary | ICD-10-CM

## 2021-02-14 DIAGNOSIS — N898 Other specified noninflammatory disorders of vagina: Secondary | ICD-10-CM

## 2021-02-14 DIAGNOSIS — Z3A14 14 weeks gestation of pregnancy: Secondary | ICD-10-CM | POA: Insufficient documentation

## 2021-02-14 DIAGNOSIS — A599 Trichomoniasis, unspecified: Secondary | ICD-10-CM

## 2021-02-14 DIAGNOSIS — L292 Pruritus vulvae: Secondary | ICD-10-CM | POA: Insufficient documentation

## 2021-02-14 LAB — WET PREP, GENITAL
Sperm: NONE SEEN
WBC, Wet Prep HPF POC: >=10 — AB (ref <10)
Yeast Wet Prep HPF POC: NONE SEEN

## 2021-02-14 MED ORDER — METRONIDAZOLE 500 MG PO TABS: 500.0000 mg | ORAL_TABLET | Freq: Two times a day (BID) | ORAL | 0 refills | Status: AC

## 2021-02-14 NOTE — MAU Provider Note (Signed)
Event Date/Time   First Provider Initiated Contact with Patient 02/14/21 1147      S Ms. Priscilla Phillips is a 25 y.o. G2P0010 patient who presents to MAU today with complaint of vaginal itching. Patient states she was seen by her OB two days ago and everything was normal. Then last night she started having both internal and external vaginal itching. Patient denies abnormal discharge, but states her discharge does seem a little different from her pregnancy discharge and it sometimes has a funny odor. Patient denies bleeding, cramping or other concerns. Reports NKDA, no PMH, only taking PNV. Has access to MyChart and will accept messages to MyChart with positive results.   O BP 121/75 (BP Location: Right Arm)   Pulse 95   Temp 98.2 F (36.8 C) (Oral)   Resp 16   Ht 4' 10.5" (1.486 m)   Wt 54.2 kg   LMP 11/10/2020 (Exact Date)   SpO2 100%   BMI 24.53 kg/m   Patient Vitals for the past 24 hrs:  BP Temp Temp src Pulse Resp SpO2 Height Weight  02/14/21 1140 121/75 98.2 F (36.8 C) Oral 95 16 100 % 4' 10.5" (1.486 m) 54.2 kg   Physical Exam Vitals and nursing note reviewed.  Constitutional:      General: She is not in acute distress.    Appearance: Normal appearance. She is not ill-appearing, toxic-appearing or diaphoretic.  Pulmonary:     Effort: Pulmonary effort is normal.  Neurological:     Mental Status: She is alert and oriented to person, place, and time.  Psychiatric:        Mood and Affect: Mood normal.        Behavior: Behavior normal.        Thought Content: Thought content normal.        Judgment: Judgment normal.   A Medical screening exam complete FHR 152 Self swabs collected by patient  P Discharge from MAU in stable condition Patient to follow-up with OB if normal swabs from MAU Warning signs for worsening condition that would warrant emergency follow-up discussed Patient may return to MAU as needed   Anetra Czerwinski, Odie Sera, NP 02/14/2021 11:54 AM

## 2021-02-14 NOTE — Progress Notes (Signed)
RX metronidazole.  Marylen Ponto, NP  1:38 PM 02/14/2021

## 2021-02-14 NOTE — MAU Note (Signed)
Having some vaginal itching, started yesterday. Has a d/c, no difference or change in it no bleeding. Occ pain in lower abd/ uterus... once last night, once this morning.

## 2021-02-14 NOTE — Telephone Encounter (Signed)
Patient called after reading MyChart message to discuss results further. Confirmed for patient that this is a sexually transmitted infection, confirmed method of transmission of infection and discussed treatment sent to pharmacy and can pick up and start tonight. Discussed implications for pregnancy. Patient questions answered to patient satisfaction and patient verbalized understanding.  Marylen Ponto, NP  2:52 PM 02/14/2021

## 2021-02-15 ENCOUNTER — Other Ambulatory Visit: Payer: Self-pay | Admitting: *Deleted

## 2021-02-15 DIAGNOSIS — Z3480 Encounter for supervision of other normal pregnancy, unspecified trimester: Secondary | ICD-10-CM

## 2021-02-15 LAB — GC/CHLAMYDIA PROBE AMP (~~LOC~~) NOT AT ARMC
Chlamydia: NEGATIVE
Comment: NEGATIVE
Comment: NORMAL
Neisseria Gonorrhea: NEGATIVE

## 2021-02-15 MED ORDER — BLOOD PRESSURE MONITOR AUTOMAT DEVI: 1.0000 | Freq: Every day | 0 refills | Status: DC

## 2021-02-15 NOTE — Progress Notes (Signed)
Rx for blood pressure monitor sent to [facility].  Clovis Pu, RN

## 2021-02-23 ENCOUNTER — Inpatient Hospital Stay (HOSPITAL_COMMUNITY)
Admission: AD | Admit: 2021-02-23 | Discharge: 2021-02-23 | Disposition: A | Discharge status: Home / Self Care | Payer: Medicaid Other | Attending: Obstetrics and Gynecology | Admitting: Obstetrics and Gynecology

## 2021-02-23 ENCOUNTER — Other Ambulatory Visit: Payer: Self-pay

## 2021-02-23 ENCOUNTER — Encounter (HOSPITAL_COMMUNITY): Payer: Self-pay | Admitting: Obstetrics and Gynecology

## 2021-02-23 ENCOUNTER — Encounter: Payer: Self-pay | Admitting: Obstetrics and Gynecology

## 2021-02-23 DIAGNOSIS — R21 Rash and other nonspecific skin eruption: Secondary | ICD-10-CM | POA: Insufficient documentation

## 2021-02-23 DIAGNOSIS — Z3480 Encounter for supervision of other normal pregnancy, unspecified trimester: Secondary | ICD-10-CM

## 2021-02-23 DIAGNOSIS — O99891 Other specified diseases and conditions complicating pregnancy: Secondary | ICD-10-CM | POA: Diagnosis not present

## 2021-02-23 DIAGNOSIS — Z3A15 15 weeks gestation of pregnancy: Secondary | ICD-10-CM | POA: Insufficient documentation

## 2021-02-23 DIAGNOSIS — B3731 Acute candidiasis of vulva and vagina: Secondary | ICD-10-CM | POA: Diagnosis not present

## 2021-02-23 DIAGNOSIS — O23592 Infection of other part of genital tract in pregnancy, second trimester: Secondary | ICD-10-CM | POA: Diagnosis not present

## 2021-02-23 DIAGNOSIS — O98812 Other maternal infectious and parasitic diseases complicating pregnancy, second trimester: Secondary | ICD-10-CM | POA: Diagnosis present

## 2021-02-23 MED ORDER — TERCONAZOLE 0.4 % VA CREA: 1.0000 | TOPICAL_CREAM | Freq: Every day | VAGINAL | 0 refills | Status: DC

## 2021-02-23 NOTE — Progress Notes (Signed)
   Pt sent a my chart message stating that she felt like she is having an allergic reaction to her medications.  I advised the pt to go to MAU to be evaluated due the office not having a provider today.  Also advised pt that if the allergic reaction is causing any anaphylactic symptoms to call 911 immediatly. I did call pt back she stated she was having she was not having anaphylactic symptoms but was having hives with no relief.   Gearldine Shown, CMA

## 2021-02-23 NOTE — MAU Note (Signed)
Priscilla Phillips is a 25 y.o. at [redacted]w[redacted]d here in MAU reporting: states she is still having discharge and is having a rash in her "private area". Is having some itching associated with the rash. Rash started around 11/25. Having some chunky white discharge, pt reports like it looks like it is stuck to her skin.  Onset of complaint: ongoing  Pain score: 0/10  Vitals:   02/23/21 1230  BP: 114/65  Pulse: 95  Resp: 16  Temp: 98.3 F (36.8 C)  SpO2: 97%     FHT: 156  Lab orders placed from triage: none

## 2021-02-23 NOTE — MAU Provider Note (Signed)
History     CSN: [id number]  Arrival date and time: 02/23/21 1221   Event Date/Time   First Provider Initiated Contact with Patient 02/23/21 1254      Chief Complaint  Patient presents with   Rash   HPI Priscilla Phillips is a 25 y.o. G2P0010 at [redacted]w[redacted]d who presents to MAU with concern for an allergic reaction. Patient initiated treatment for Trichomonas on 02/14/2021. About three days later she experienced a "white, chunky discharge"  that appears "stuck to my skin" on her labia. She endorses mild itching in her perineal area. She denies pain. She denies vaginal bleeding, dysuria, fever. She is not sexually active and denies concern for re-exposure to STIs.  Patient receives care with [facility].  OB History     Gravida  2   Para      Term      Preterm      AB  1   Living         SAB  1   IAB      Ectopic      Multiple      Live Births              Past Medical History:  Diagnosis Date   Anxiety    Ovarian cyst    Trichomonas infection    UTI (urinary tract infection)     Past Surgical History:  Procedure Laterality Date   NO PAST SURGERIES      Family History  Problem Relation Age of Onset   Depression Mother    Cancer Mother        skin   Healthy Father    Diabetes Maternal Grandmother    Hypertension Maternal Grandmother     Social History   Tobacco Use   Smoking status: Never   Smokeless tobacco: Never  Vaping Use   Vaping Use: Former  Substance Use Topics   Alcohol use: Never   Drug use: Not Currently    Types: Marijuana    Comment: July 2022    Allergies: No Known Allergies  Medications Prior to Admission  Medication Sig Dispense Refill Last Dose   Prenatal Vit-Fe Fumarate-FA (PRENATAL VITAMIN) 27-0.8 MG TABS Take 1 tablet by mouth daily. 30 tablet 11 02/22/2021   Blood Pressure Monitoring (BLOOD PRESSURE MONITOR AUTOMAT) DEVI 1 Device by Does not apply route daily. Automatic blood pressure cuff regular size. To  monitor blood pressure regularly at home. ICD-10 code:Z34.90 1 each 0     Review of Systems  Genitourinary:  Positive for vaginal discharge and vaginal pain.  All other systems reviewed and are negative. Physical Exam   Blood pressure (!) 112/58, pulse 83, temperature 98.5 F (36.9 C), temperature source Oral, resp. rate 17, last menstrual period 11/10/2020, SpO2 98 %.  Physical Exam Vitals and nursing note reviewed. Exam conducted with a chaperone present.  Constitutional:      Appearance: Normal appearance.  Cardiovascular:     Rate and Rhythm: Normal rate.     Pulses: Normal pulses.  Pulmonary:     Effort: Pulmonary effort is normal.  Genitourinary:    Comments: Thick white discharge visible perineum. Superficial irritation on labia near pubic hair clipping Neurological:     Mental Status: She is alert.  Psychiatric:        Mood and Affect: Mood normal.        Behavior: Behavior normal.        Thought Content: Thought content normal.  Judgment: Judgment normal.    MAU Course/MDM  Procedures  --Yeast infection. Given recent treatment for STD will treat presumptively, swabs not collected. Discussed possibility of rebound Bacterial Vaginosis. Not an emergency, not an allergic reaction, patient should feel safe following up in office.   Patient Vitals for the past 24 hrs:  BP Temp Temp src Pulse Resp SpO2  02/23/21 1307 (!) 112/58 98.5 F (36.9 C) Oral 83 17 98 %  02/23/21 1305 (!) 112/58 98.5 F (36.9 C) Oral 83 17 --  02/23/21 1251 115/68 -- -- 90 -- --  02/23/21 1230 114/65 98.3 F (36.8 C) Oral 95 16 97 %   Meds ordered this encounter  Medications   terconazole (TERAZOL 7) 0.4 % vaginal cream    Sig: Place 1 applicator vaginally at bedtime. May apply to external irritation PRN, Use for seven days    Dispense:  45 g    Refill:  0    Order Specific Question:   Supervising Provider    Answer:   Warden Fillers [[id number]]   Assessment and Plan  --25 y.o.  G2P0010 at [redacted]w[redacted]d  --FHT 156 by Doppler --Rebound yeast infection s/p treatment for Trichomonas --Terazol appropriate for internal and external application to affected areas PRN --Reviewed hygiene regimen to reduce irritants, allergens --Discharge home in stable condition  Calvert Cantor, CNM 02/23/2021, 5:07 PM

## 2021-02-24 DIAGNOSIS — Z3490 Encounter for supervision of normal pregnancy, unspecified, unspecified trimester: Secondary | ICD-10-CM | POA: Diagnosis not present

## 2021-02-26 ENCOUNTER — Telehealth: Payer: Self-pay

## 2021-03-03 NOTE — Progress Notes (Signed)
   Pt Genetic testing was mislabeled with another pt sample.  Pt was made aware when she had her apt with Gerrit Heck, CNM on 02/12/2021.  Pt stated she did not want another genetic screening done.  Provider made aware of mislabeling as well.   Gearldine Shown, CMA

## 2021-03-04 ENCOUNTER — Telehealth: Payer: Self-pay | Admitting: Obstetrics and Gynecology

## 2021-03-04 ENCOUNTER — Encounter: Payer: Medicaid Other | Admitting: Obstetrics and Gynecology

## 2021-03-04 NOTE — Telephone Encounter (Signed)
Patient is requesting a call back to discuss some lab work, and retesting.

## 2021-03-05 NOTE — Telephone Encounter (Signed)
Patient called regarding retesting. Patient advised she will need TOC at next OB visit. Appt changed from Mychart to in-person.  Clovis Pu, RN

## 2021-03-18 ENCOUNTER — Ambulatory Visit: Payer: Medicaid Other | Attending: Obstetrics and Gynecology

## 2021-03-18 ENCOUNTER — Other Ambulatory Visit (HOSPITAL_COMMUNITY)
Admission: RE | Admit: 2021-03-18 | Discharge: 2021-03-18 | Disposition: A | Discharge status: Home / Self Care | Payer: Medicaid Other | Source: Ambulatory Visit | Attending: Obstetrics and Gynecology | Admitting: Obstetrics and Gynecology

## 2021-03-18 ENCOUNTER — Ambulatory Visit (INDEPENDENT_AMBULATORY_CARE_PROVIDER_SITE_OTHER): Payer: Medicaid Other | Admitting: Obstetrics and Gynecology

## 2021-03-18 ENCOUNTER — Other Ambulatory Visit: Payer: Self-pay

## 2021-03-18 VITALS — BP 110/76 | HR 94 | Temp 97.8°F | Wt 124.0 lb

## 2021-03-18 DIAGNOSIS — Z3480 Encounter for supervision of other normal pregnancy, unspecified trimester: Secondary | ICD-10-CM | POA: Diagnosis present

## 2021-03-18 DIAGNOSIS — Z3401 Encounter for supervision of normal first pregnancy, first trimester: Secondary | ICD-10-CM

## 2021-03-18 DIAGNOSIS — Z3A18 18 weeks gestation of pregnancy: Secondary | ICD-10-CM | POA: Insufficient documentation

## 2021-03-18 DIAGNOSIS — Z363 Encounter for antenatal screening for malformations: Secondary | ICD-10-CM | POA: Insufficient documentation

## 2021-03-18 DIAGNOSIS — Z113 Encounter for screening for infections with a predominantly sexual mode of transmission: Secondary | ICD-10-CM

## 2021-03-18 DIAGNOSIS — Z3402 Encounter for supervision of normal first pregnancy, second trimester: Secondary | ICD-10-CM | POA: Diagnosis present

## 2021-03-18 DIAGNOSIS — Z3A11 11 weeks gestation of pregnancy: Secondary | ICD-10-CM

## 2021-03-18 LAB — OB RESULTS CONSOLE GC/CHLAMYDIA: Gonorrhea: NEGATIVE

## 2021-03-23 LAB — CERVICOVAGINAL ANCILLARY ONLY
Bacterial Vaginitis (gardnerella): NEGATIVE
Candida Glabrata: NEGATIVE
Candida Vaginitis: NEGATIVE
Chlamydia: NEGATIVE
Comment: NEGATIVE
Comment: NEGATIVE
Comment: NEGATIVE
Comment: NEGATIVE
Comment: NEGATIVE
Comment: NORMAL
Neisseria Gonorrhea: NEGATIVE
Trichomonas: NEGATIVE

## 2021-03-24 ENCOUNTER — Encounter: Payer: Self-pay | Admitting: Obstetrics and Gynecology

## 2021-03-24 NOTE — Progress Notes (Signed)
° °  LOW-RISK PREGNANCY OFFICE VISIT Patient name: Priscilla Phillips MRN [id number]  Date of birth: 11/18/1995 Chief Complaint:   Routine Prenatal Visit  History of Present Illness:   Priscilla Phillips is a 25 y.o. G45P0010 female at [redacted]w[redacted]d with an Estimated Date of Delivery: 08/17/21 being seen today for ongoing management of a low-risk pregnancy.  Today she reports no complaints. Contractions: Irritability. Vag. Bleeding: None.  Movement: Present. denies leaking of fluid. Review of Systems:   Pertinent items are noted in HPI Denies abnormal vaginal discharge w/ itching/odor/irritation, headaches, visual changes, shortness of breath, chest pain, abdominal pain, severe nausea/vomiting, or problems with urination or bowel movements unless otherwise stated above. Pertinent History Reviewed:  Reviewed past medical,surgical, social, obstetrical and family history.  Reviewed problem list, medications and allergies. Physical Assessment:   Vitals:   03/18/21 1546  BP: 110/76  Pulse: 94  Temp: 97.8 F (36.6 C)  Weight: 124 lb (56.2 kg)  Body mass index is 25.47 kg/m.        Physical Examination:   General appearance: Well appearing, and in no distress  Mental status: Alert, oriented to person, place, and time  Skin: Warm & dry  Cardiovascular: Normal heart rate noted  Respiratory: Normal respiratory effort, no distress  Abdomen: Soft, gravid, nontender  Pelvic: Cervical exam deferred         Extremities: Edema: None  Fetal Status:   Fundal Height: 155 cm Movement: Present    No results found for this or any previous visit (from the past 24 hour(s)).  Assessment & Plan:  1) Low-risk pregnancy G2P0010 at [redacted]w[redacted]d with an Estimated Date of Delivery: 08/17/21   2) Supervision of other normal pregnancy, antepartum  - AFP nv  3) [redacted] weeks gestation of pregnancy    4) Screen for STD (sexually transmitted disease)  - Cervicovaginal ancillary only( [place])    Meds: No orders of the  defined types were placed in this encounter.  Labs/procedures today: none  Plan:  Continue routine obstetrical care   Reviewed: Preterm labor symptoms and general obstetric precautions including but not limited to vaginal bleeding, contractions, leaking of fluid and fetal movement were reviewed in detail with the patient.  All questions were answered. Has home bp cuff. Check bp weekly, let us know if >140/90.   Follow-up: Return in about 4 weeks (around 04/15/2021) for Return OB visit.  No orders of the defined types were placed in this encounter.  Raelyn Mora MSN, CNM 03/18/2021 3:46 PM

## 2021-04-01 ENCOUNTER — Encounter: Payer: Medicaid Other | Admitting: Obstetrics and Gynecology

## 2021-04-05 ENCOUNTER — Encounter: Payer: Self-pay | Admitting: *Deleted

## 2021-04-05 DIAGNOSIS — Z0120 Encounter for dental examination and cleaning without abnormal findings: Secondary | ICD-10-CM

## 2021-04-05 DIAGNOSIS — Z3480 Encounter for supervision of other normal pregnancy, unspecified trimester: Secondary | ICD-10-CM

## 2021-04-05 NOTE — Progress Notes (Signed)
Patient called requesting a referral to a dental office. Letter was completed. However, patient stated that [facility] office will not accept a letter, that an order for referral be placed. Referral completed.  Clovis Pu, RN

## 2021-04-15 ENCOUNTER — Encounter: Payer: Medicaid Other | Admitting: Obstetrics and Gynecology

## 2021-04-22 ENCOUNTER — Encounter: Payer: Medicaid Other | Admitting: Obstetrics and Gynecology

## 2021-04-29 ENCOUNTER — Other Ambulatory Visit: Payer: Self-pay

## 2021-04-29 ENCOUNTER — Encounter: Payer: Self-pay | Admitting: Emergency Medicine

## 2021-04-29 ENCOUNTER — Ambulatory Visit
Admission: EM | Admit: 2021-04-29 | Discharge: 2021-04-29 | Disposition: A | Discharge status: Home / Self Care | Payer: Medicaid Other | Attending: Internal Medicine | Admitting: Internal Medicine

## 2021-04-29 ENCOUNTER — Encounter: Payer: Medicaid Other | Admitting: Obstetrics and Gynecology

## 2021-04-29 DIAGNOSIS — N390 Urinary tract infection, site not specified: Secondary | ICD-10-CM | POA: Insufficient documentation

## 2021-04-29 LAB — POCT URINALYSIS DIP (MANUAL ENTRY)
Glucose, UA: NEGATIVE mg/dL
Ketones, POC UA: NEGATIVE mg/dL
Nitrite, UA: NEGATIVE
Protein Ur, POC: NEGATIVE mg/dL
Spec Grav, UA: 1.025 (ref 1.010–1.025)
Urobilinogen, UA: 0.2 E.U./dL
pH, UA: 6.5 (ref 5.0–8.0)

## 2021-04-29 MED ORDER — AMOXICILLIN 875 MG PO TABS: 875.0000 mg | ORAL_TABLET | Freq: Two times a day (BID) | ORAL | 0 refills | Status: AC

## 2021-04-29 NOTE — ED Provider Notes (Signed)
[facility]    CSN: [id number] Arrival date & time: 04/29/21  0850      History   Chief Complaint Chief Complaint  Patient presents with   Dysuria    HPI Priscilla Phillips is a 26 y.o. female.   Patient presents with urinary burning and urinary frequency that started approximately 3 days ago.  Denies vaginal discharge, hematuria, pelvic pain, abdominal pain, fever, back pain.  Denies any known exposure to STD.  Patient is currently pregnant.   Dysuria  Past Medical History:  Diagnosis Date   Anxiety    Ovarian cyst    Trichomonas infection    UTI (urinary tract infection)     Patient Active Problem List   Diagnosis Date Noted   Trichomonas infection 02/14/2021   History of anxiety 02/12/2021   Supervision of other normal pregnancy, antepartum 02/01/2021    Past Surgical History:  Procedure Laterality Date   NO PAST SURGERIES      OB History     Gravida  2   Para      Term      Preterm      AB  1   Living         SAB  1   IAB      Ectopic      Multiple      Live Births               Home Medications    Prior to Admission medications   Medication Sig Start Date End Date Taking? Authorizing Provider  amoxicillin (AMOXIL) 875 MG tablet Take 1 tablet (875 mg total) by mouth 2 (two) times daily for 5 days. 04/29/21 05/04/21 Yes Yeng Frankie, Hildred Alamin E, FNP  Blood Pressure Monitoring (BLOOD PRESSURE MONITOR AUTOMAT) DEVI 1 Device by Does not apply route daily. Automatic blood pressure cuff regular size. To monitor blood pressure regularly at home. ICD-10 code:Z34.90 02/15/21   Laury Deep, CNM  Prenatal Vit-Fe Fumarate-FA (PRENATAL VITAMIN) 27-0.8 MG TABS Take 1 tablet by mouth daily. 12/10/20   Clarnce Flock, MD  terconazole (TERAZOL 7) 0.4 % vaginal cream Place 1 applicator vaginally at bedtime. May apply to external irritation PRN, Use for seven days 02/23/21   Darlina Rumpf, CNM    Family History Family History   Problem Relation Age of Onset   Depression Mother    Cancer Mother        skin   Healthy Father    Diabetes Maternal Grandmother    Hypertension Maternal Grandmother     Social History Social History   Tobacco Use   Smoking status: Never   Smokeless tobacco: Never  Vaping Use   Vaping Use: Former  Substance Use Topics   Alcohol use: Never   Drug use: Not Currently    Types: Marijuana    Comment: July 2022     Allergies   Patient has no known allergies.   Review of Systems Review of Systems Per HPI  Physical Exam Triage Vital Signs ED Triage Vitals  Enc Vitals Group     BP 04/29/21 1028 115/79     Pulse Rate 04/29/21 1028 94     Resp 04/29/21 1028 16     Temp 04/29/21 1028 (!) 97.5 F (36.4 C)     Temp Source 04/29/21 1028 Oral     SpO2 04/29/21 1028 98 %     Weight --      Height --  Head Circumference --      Peak Flow --      Pain Score 04/29/21 1032 0     Pain Loc --      Pain Edu? --      Excl. in GC? --    No data found.  Updated Vital Signs BP 115/79 (BP Location: Left Arm)    Pulse 94    Temp (!) 97.5 F (36.4 C) (Oral)    Resp 16    LMP 11/10/2020 (Exact Date)    SpO2 98%   Visual Acuity Right Eye Distance:   Left Eye Distance:   Bilateral Distance:    Right Eye Near:   Left Eye Near:    Bilateral Near:     Physical Exam Constitutional:      General: She is not in acute distress.    Appearance: Normal appearance. She is not toxic-appearing or diaphoretic.  HENT:     Head: Normocephalic and atraumatic.  Eyes:     Extraocular Movements: Extraocular movements intact.     Conjunctiva/sclera: Conjunctivae normal.  Cardiovascular:     Rate and Rhythm: Normal rate and regular rhythm.     Pulses: Normal pulses.     Heart sounds: Normal heart sounds.  Pulmonary:     Effort: Pulmonary effort is normal. No respiratory distress.     Breath sounds: Normal breath sounds.  Neurological:     General: No focal deficit present.      Mental Status: She is alert and oriented to person, place, and time. Mental status is at baseline.  Psychiatric:        Mood and Affect: Mood normal.        Behavior: Behavior normal.        Thought Content: Thought content normal.        Judgment: Judgment normal.     UC Treatments / Results  Labs (all labs ordered are listed, but only abnormal results are displayed) Labs Reviewed  POCT URINALYSIS DIP (MANUAL ENTRY) - Abnormal; Notable for the following components:      Result Value   Clarity, UA cloudy (*)    Bilirubin, UA small (*)    Blood, UA trace-lysed (*)    Leukocytes, UA Moderate (2+) (*)    All other components within normal limits  URINE CULTURE    EKG   Radiology No results found.  Procedures Procedures (including critical care time)  Medications Ordered in UC Medications - No data to display  Initial Impression / Assessment and Plan / UC Course  I have reviewed the triage vital signs and the nursing notes.  Pertinent labs & imaging results that were available during my care of the patient were reviewed by me and considered in my medical decision making (see chart for details).     Urinalysis indicating possible urinary tract infection.  Will opt to treat with amoxicillin given the patient is pregnant.  Urine culture is pending.  Patient to follow-up with OB/GYN for further evaluation and management.  Discussed return precautions.  Patient verbalized understanding and was agreeable with plan. Final Clinical Impressions(s) / UC Diagnoses   Final diagnoses:  Lower urinary tract infection     Discharge Instructions      It appears that you have a urinary tract infection.  You have been treated with amoxicillin antibiotic which is safe with pregnancy.  Please follow-up with OB/GYN for further evaluation and management.    ED Prescriptions     Medication Sig Dispense  Auth. Provider   amoxicillin (AMOXIL) 875 MG tablet Take 1 tablet (875 mg total) by  mouth 2 (two) times daily for 5 days. 10 tablet Teodora Medici, [place]      PDMP not reviewed this encounter.   Teodora Medici, Time 04/29/21 1057

## 2021-04-29 NOTE — Discharge Instructions (Signed)
It appears that you have a urinary tract infection.  You have been treated with amoxicillin antibiotic which is safe with pregnancy.  Please follow-up with OB/GYN for further evaluation and management.

## 2021-04-29 NOTE — ED Triage Notes (Signed)
Pain and increased frequency with urination. Also having pelvic pain, is pregnant, due in May. Denies new discharge, hematuria

## 2021-04-30 LAB — URINE CULTURE

## 2021-05-10 ENCOUNTER — Encounter: Payer: Self-pay | Admitting: Obstetrics and Gynecology

## 2021-05-20 ENCOUNTER — Encounter: Payer: Self-pay | Admitting: Obstetrics and Gynecology

## 2021-05-20 ENCOUNTER — Ambulatory Visit (INDEPENDENT_AMBULATORY_CARE_PROVIDER_SITE_OTHER): Payer: Medicaid Other | Admitting: Obstetrics and Gynecology

## 2021-05-20 ENCOUNTER — Other Ambulatory Visit: Payer: Self-pay

## 2021-05-20 VITALS — BP 108/74 | HR 100 | Temp 98.2°F | Wt 146.8 lb

## 2021-05-20 DIAGNOSIS — Z131 Encounter for screening for diabetes mellitus: Secondary | ICD-10-CM | POA: Diagnosis not present

## 2021-05-20 DIAGNOSIS — Z3482 Encounter for supervision of other normal pregnancy, second trimester: Secondary | ICD-10-CM | POA: Diagnosis not present

## 2021-05-20 DIAGNOSIS — Z3A27 27 weeks gestation of pregnancy: Secondary | ICD-10-CM

## 2021-05-20 NOTE — Progress Notes (Signed)
° °  LOW-RISK PREGNANCY OFFICE VISIT Patient name: Priscilla Phillips MRN [id number]  Date of birth: [birth year] Chief Complaint:   Routine Prenatal Visit and PN2  History of Present Illness:   Priscilla Phillips is a 26 y.o. G62P0010 female at [redacted]w[redacted]d with an Estimated Date of Delivery: 08/17/21 being seen today for ongoing management of a low-risk pregnancy.  Today she reports no complaints. Contractions: Irritability. Vag. Bleeding: None.  Movement: Present. denies leaking of fluid. Review of Systems:   Pertinent items are noted in HPI Denies abnormal vaginal discharge w/ itching/odor/irritation, headaches, visual changes, shortness of breath, chest pain, abdominal pain, severe nausea/vomiting, or problems with urination or bowel movements unless otherwise stated above. Pertinent History Reviewed:  Reviewed past medical,surgical, social, obstetrical and family history.  Reviewed problem list, medications and allergies. Physical Assessment:   Vitals:   05/20/21 0859  BP: 108/74  Pulse: 100  Temp: 98.2 F (36.8 C)  Weight: 146 lb 12.8 oz (66.6 kg)  Body mass index is 30.16 kg/m.        Physical Examination:   General appearance: Well appearing, and in no distress  Mental status: Alert, oriented to person, place, and time  Skin: Warm & dry  Cardiovascular: Normal heart rate noted  Respiratory: Normal respiratory effort, no distress  Abdomen: Soft, gravid, nontender  Pelvic: Cervical exam deferred         Extremities: Edema: None  Fetal Status: Fetal Heart Rate (bpm): 140 Fundal Height: 27 cm Movement: Present    No results found for this or any previous visit (from the past 24 hour(s)).  Assessment & Plan:  1) Low-risk pregnancy G2P0010 at [redacted]w[redacted]d with an Estimated Date of Delivery: 08/17/21   2) Encounter for supervision of other normal pregnancy in second trimester  - Antibody screen,  - CBC,  - Glucose Tolerance, 2 Hours w/1 Hour,  - HIV Antibody (routine testing w rflx),  -  RPR - Information provided on [facility] options   3) [redacted] weeks gestation of pregnancy   4) Screening for diabetes mellitus  - Glucose Tolerance, 2 Hours w/1 Hour    Meds: No orders of the defined types were placed in this encounter.  Labs/procedures today: 2 hr GTT  Plan:  Continue routine obstetrical care   Reviewed: Preterm labor symptoms and general obstetric precautions including but not limited to vaginal bleeding, contractions, leaking of fluid and fetal movement were reviewed in detail with the patient.  All questions were answered. Has home bp cuff. Check bp weekly, let us know if >140/90.   Follow-up: Return in about 4 weeks (around 06/17/2021) for Return OB visit.  Orders Placed This Encounter  Procedures   CBC   Glucose Tolerance, 2 Hours w/1 Hour   HIV Antibody (routine testing w rflx)   RPR   Antibody screen   Raelyn Mora MSN, CNM 05/20/2021 9:14 AM

## 2021-05-21 LAB — CBC
Hematocrit: 36 % (ref 34.0–46.6)
Hemoglobin: 12.4 g/dL (ref 11.1–15.9)
MCH: 31.1 pg (ref 26.6–33.0)
MCHC: 34.4 g/dL (ref 31.5–35.7)
MCV: 90 fL (ref 79–97)
Platelets: 200 10*3/uL (ref 150–450)
RBC: 3.99 x10E6/uL (ref 3.77–5.28)
RDW: 12.1 % (ref 11.7–15.4)
WBC: 10.4 10*3/uL (ref 3.4–10.8)

## 2021-05-21 LAB — HIV ANTIBODY (ROUTINE TESTING W REFLEX): HIV Screen 4th Generation wRfx: NONREACTIVE

## 2021-05-21 LAB — GLUCOSE TOLERANCE, 2 HOURS W/ 1HR
Glucose, 1 hour: 157 mg/dL (ref 70–179)
Glucose, 2 hour: 144 mg/dL (ref 70–152)
Glucose, Fasting: 76 mg/dL (ref 70–91)

## 2021-05-21 LAB — ANTIBODY SCREEN: Antibody Screen: NEGATIVE

## 2021-05-21 LAB — RPR: RPR Ser Ql: NONREACTIVE

## 2021-05-25 ENCOUNTER — Ambulatory Visit: Payer: Medicaid Other | Admitting: Nurse Practitioner

## 2021-05-28 ENCOUNTER — Encounter: Payer: Self-pay | Admitting: Obstetrics and Gynecology

## 2021-05-31 ENCOUNTER — Telehealth: Payer: Self-pay | Admitting: *Deleted

## 2021-05-31 NOTE — Telephone Encounter (Signed)
Opened in error ?Aahil Fredin,RN ?

## 2021-06-04 ENCOUNTER — Encounter: Payer: Medicaid Other | Admitting: Obstetrics and Gynecology

## 2021-06-11 ENCOUNTER — Other Ambulatory Visit: Payer: Self-pay

## 2021-06-11 ENCOUNTER — Inpatient Hospital Stay (HOSPITAL_COMMUNITY)
Admission: AD | Admit: 2021-06-11 | Discharge: 2021-06-11 | Disposition: A | Discharge status: Home / Self Care | Payer: Medicaid Other | Attending: Obstetrics and Gynecology | Admitting: Obstetrics and Gynecology

## 2021-06-11 DIAGNOSIS — O23599 Infection of other part of genital tract in pregnancy, unspecified trimester: Secondary | ICD-10-CM | POA: Diagnosis not present

## 2021-06-11 DIAGNOSIS — Z3A30 30 weeks gestation of pregnancy: Secondary | ICD-10-CM | POA: Insufficient documentation

## 2021-06-11 DIAGNOSIS — Z3480 Encounter for supervision of other normal pregnancy, unspecified trimester: Secondary | ICD-10-CM

## 2021-06-11 DIAGNOSIS — O98813 Other maternal infectious and parasitic diseases complicating pregnancy, third trimester: Secondary | ICD-10-CM | POA: Insufficient documentation

## 2021-06-11 DIAGNOSIS — N760 Acute vaginitis: Secondary | ICD-10-CM | POA: Diagnosis not present

## 2021-06-11 DIAGNOSIS — B379 Candidiasis, unspecified: Secondary | ICD-10-CM | POA: Diagnosis not present

## 2021-06-11 LAB — WET PREP, GENITAL
Clue Cells Wet Prep HPF POC: NONE SEEN
Sperm: NONE SEEN
Trich, Wet Prep: NONE SEEN
WBC, Wet Prep HPF POC: >=10 — AB (ref <10)

## 2021-06-11 MED ORDER — TERCONAZOLE 0.4 % VA CREA: 1.0000 | TOPICAL_CREAM | Freq: Every day | VAGINAL | 0 refills | Status: DC

## 2021-06-11 NOTE — MAU Note (Signed)
.  Priscilla Phillips is a 26 y.o. at [redacted]w[redacted]d here in MAU reporting: vaginal itching and discharge.  Reports has white clumpy odorless discharge.  Denies VB. ? ?Onset of complaint: 7 days ago ?Pain score:0 ?Vitals:  ? 06/11/21 1635  ?BP: 122/73  ?Pulse: (!) 107  ?Resp: 18  ?Temp: 97.9 ?F (36.6 ?C)  ?SpO2: 98%  ?   ?FHT: 142 bpm w/ +FM.  Denies LOF ?Lab orders placed from triage:   ?

## 2021-06-11 NOTE — MAU Provider Note (Signed)
None  ? ?S ?Ms. Priscilla Phillips is a 26 y.o. G2P0010 at [redacted]w[redacted]d who presents to MAU today with complaint of yeast infection. Patient reports she has had a thick/clumpy, white, "cottage-cheese" appearing discharge for the last week. She also has a lot of vaginal itching/irritation. She denies contractions, vaginal bleeding, leaking fluid, or other concerning symptoms. Endorses active fetal movement. ? ?Patient has appointment at [facility] on 3/23. ? ?O ?BP 122/73 (BP Location: Right Arm)   Pulse (!) 107   Temp 97.9 ?F (36.6 ?C) (Oral)   Resp 18   Ht 4\' 10"  (1.473 m)   Wt 67.7 kg   LMP 11/10/2020 (Exact Date)   SpO2 98%   BMI 31.20 kg/m?  ?Physical Exam ?Vitals and nursing note reviewed.  ?Constitutional:   ?   General: She is not in acute distress. ?Eyes:  ?   Extraocular Movements: Extraocular movements intact.  ?   Pupils: Pupils are equal, round, and reactive to light.  ?Cardiovascular:  ?   Rate and Rhythm: Normal rate.  ?Pulmonary:  ?   Effort: Pulmonary effort is normal.  ?Abdominal:  ?   Palpations: Abdomen is soft.  ?   Tenderness: There is no abdominal tenderness.  ?   Comments: Gravid ?  ?Genitourinary: ?   Comments: Patient self swabbed ?Musculoskeletal:  ?   Cervical back: Normal range of motion.  ?Skin: ?   General: Skin is warm and dry.  ?Neurological:  ?   General: No focal deficit present.  ?   Mental Status: She is alert and oriented to person, place, and time.  ?Psychiatric:     ?   Mood and Affect: Mood normal.     ?   Behavior: Behavior normal.     ?   Thought Content: Thought content normal.     ?   Judgment: Judgment normal.  ? ?FHR: 142 bpm via doppler ? ?A ?Medical screening exam complete ?[redacted] weeks gestation of pregnancy ?Vaginal discharge ?Yeast infection ? ?P ?Discharge from MAU in stable condition ?Rx for Terazol sent to pharmacy ?Warning signs for worsening condition that would warrant emergency follow-up discussed ?Patient may return to MAU as needed  ?Keep OB appointment as  scheduled on 06/17/2021 ? ? ?06/19/2021, CNM ?06/11/2021 5:21 PM  ? ?

## 2021-06-14 LAB — GC/CHLAMYDIA PROBE AMP (~~LOC~~) NOT AT ARMC
Chlamydia: NEGATIVE
Comment: NEGATIVE
Comment: NORMAL
Neisseria Gonorrhea: NEGATIVE

## 2021-06-17 ENCOUNTER — Encounter: Payer: Medicaid Other | Admitting: Certified Nurse Midwife

## 2021-06-17 ENCOUNTER — Ambulatory Visit (INDEPENDENT_AMBULATORY_CARE_PROVIDER_SITE_OTHER): Payer: Medicaid Other | Admitting: Obstetrics and Gynecology

## 2021-06-17 ENCOUNTER — Other Ambulatory Visit: Payer: Self-pay

## 2021-06-17 ENCOUNTER — Encounter: Payer: Self-pay | Admitting: Obstetrics and Gynecology

## 2021-06-17 VITALS — BP 111/71 | HR 100 | Wt 149.7 lb

## 2021-06-17 DIAGNOSIS — Z3480 Encounter for supervision of other normal pregnancy, unspecified trimester: Secondary | ICD-10-CM

## 2021-06-17 DIAGNOSIS — Z3A31 31 weeks gestation of pregnancy: Secondary | ICD-10-CM

## 2021-06-17 DIAGNOSIS — Z5941 Food insecurity: Secondary | ICD-10-CM

## 2021-06-17 NOTE — Progress Notes (Signed)
? ?  PRENATAL VISIT NOTE ? ?Subjective:  ?ANNALEIA Phillips is a 26 y.o. G2P0010 at [redacted]w[redacted]d being seen today for ongoing prenatal care.  She is currently monitored for the following issues for this low-risk pregnancy and has Supervision of other normal pregnancy, antepartum; History of anxiety; and Trichomonas infection on their problem list. ? ?Patient reports no complaints.  Contractions: Irritability. Vag. Bleeding: None.  Movement: Present. Denies leaking of fluid.  ? ?The following portions of the patient's history were reviewed and updated as appropriate: allergies, current medications, past family history, past medical history, past social history, past surgical history and problem list.  ? ?Objective:  ? ?Vitals:  ? 06/17/21 1345  ?BP: 111/71  ?Pulse: 100  ?Weight: 149 lb 11.2 oz (67.9 kg)  ? ? ?Fetal Status: Fetal Heart Rate (bpm): 145 Fundal Height: 31 cm Movement: Present    ? ?General:  Alert, oriented and cooperative. Patient is in no acute distress.  ?Skin: Skin is warm and dry. No rash noted.   ?Cardiovascular: Normal heart rate noted  ?Respiratory: Normal respiratory effort, no problems with respiration noted  ?Abdomen: Soft, gravid, appropriate for gestational age.  Pain/Pressure: Present     ?Pelvic: Cervical exam deferred        ?Extremities: Normal range of motion.  Edema: None  ?Mental Status: Normal mood and affect. Normal behavior. Normal judgment and thought content.  ? ?Assessment and Plan:  ?Pregnancy: G2P0010 at [redacted]w[redacted]d ?1. Supervision of other normal pregnancy, antepartum ?Patient is doing well without complaints ?Undecided on pediatrician- list provided ?Plans birth control pills for contraception ?Patient is thinking about tdap ? ?2. Food insecurity ?Food bank visit ? ?Preterm labor symptoms and general obstetric precautions including but not limited to vaginal bleeding, contractions, leaking of fluid and fetal movement were reviewed in detail with the patient. ?Please refer to After Visit  Summary for other counseling recommendations.  ? ?Return in about 2 weeks (around 07/01/2021) for in person, ROB, Low risk. ? ?No future appointments. ? ?Catalina Antigua, MD ? ?

## 2021-06-17 NOTE — Patient Instructions (Signed)
AREA PEDIATRIC/FAMILY PRACTICE PHYSICIANS ? ?[place] ([address]) ?[facility] ?Chambliss, MD; Eniola, MD; Hale, MD; Hensel, MD; McDiarmid, MD; McIntyer, MD; Neal, MD; Walden, MD ?[address] [place], [place] [address] ?[phone] ?Mon-Fri 8:30-12:30, 1:30-5:00 ?Providers come to see babies at [facility] ?Accepting Medicaid ?[facility] ?Limited providers who accept newborns: Koirala, MD; Morrow, MD; Wolters, MD ?[address], [place], [place] [address] ?[phone] ?Mon-Fri 8:00-5:30 ?Babies seen by providers at [facility] ?Does NOT accept Medicaid ?Please call early in hospitalization for appointment (limited availability)  ?[facility] ?Mulberry, MD ?[address] [place], [place] [address] ?[phone] ?Mon, Tue, Thur, Fri 8:30-5:00, Wed 10:00-7:00 (closed 1-2pm) ?Babies seen by [facility] providers ?Accepting Medicaid ?[facility] - Pediatrician ?Rubin, MD ?[address], [place], [place] [address] ?[phone] ?Mon-Fri 8:30-5:00, Sat 8:30-12:00 ?Provider comes to see babies at [facility] ?Accepting Medicaid ?Must have been referred from current patients or contacted office prior to delivery ?[facility] ([facility]) ?Brown, MD; Chandler, MD; Ettefagh, MD; Grant, MD; Lester, MD; McCormick, MD; McQueen, MD; Prose, MD; Simha, MD; Stanley, MD; Stryffeler, NP; Tebben, NP ?[address], [place], [place] [address] ?[phone] ?Mon, Tue, Thur, Fri 8:30-5:30, Wed 9:30-5:30, Sat 8:30-12:30 ?Babies seen by [facility] providers ?Accepting Medicaid ?Only accepting infants of first-time parents or siblings of current patients ?Hospital discharge coordinator will make follow-up appointment ?Jack Amos ?[address], [place], [place]  [address] ?[phone]   Fax - [phone] ?[facility] ?[address], [place], [place]  [address] ?Phone - [phone]   Fax - [phone] ?Shilpa Gosrani ?[address], [place], [place]  [address] ?[phone] ? ?[place] ([address]) ? [facility] ?Bates, MD; [place]assfield, MD; Cooper, Cox, MD; MD; Davis, MD; Dovico, MD; Ettefaugh, MD; Little, MD; Lowe, MD; Keiffer, MD; Melvin, MD; Sumner, MD; Williams, MD ?[address], [place], [place] [address] ?[phone] ?Mon-Fri 8:30-5:00 (extended evenings Mon-Thur as needed), Sat-Sun 10:00-1:00 ?Providers come to see babies at [facility] ?Accepting Medicaid for families of first-time babies and families with all children in the household age 3 and under. Must register with office prior to making appointment (M-F only). ?[facility] ?Henson, NP; Knapp, MD; Lalonde, MD; Tysinger, PA ?[address] [place], [place] [address] ?[phone] ?Mon-Fri 8:00-5:00 ?Babies seen by providers at [facility] ?Does NOT accept Medicaid/Commercial Insurance Only ?[facility] ([facility])  ?Artis, MD; Barnes, MD; Bratton, MD; Coccaro, MD; Lockett Gardner, MD; Kramer, MD; Marshall, MD; Netherton, MD; Poleto, MD; Skinner, MD ?[address] [place], [place] [address] ?[phone] ?Mon-Fri 8:30-5:30, Sat (Oct.-Mar.) 9:00-1:00 ?Babies seen by providers at [facility] ?Accepting Medicaid ? ?[place] ([address]) ?[facility] ?Reid, MD; Warner, MD ?[address], [place], [place] [address] ?[phone] ?Mon-Fri 8:30-5:00, Sat 8:30-12:00 ?Providers come to see babies at [facility] ?Does NOT accept Medicaid ?[facility] ?Becker, PA; Hagler, MD; Scifres, PA; Sun, MD; Swayne, MD ?[address], [place],  [address] ?[phone] ?Mon-Fri 8:00-5:00 ?Babies seen by providers at [facility] ?Does NOT accept Medicaid ?Only accepting babies of parents who  are patients ?Please call early in hospitalization for appointment (limited availability) ? [facility] ?Clark, MD; Frye, MD; Kelleher, MD; Mack, NP; Miller, MD; O'Keller, MD; Patterson, NP; Pudlo, MD; Puzio, MD; Thomas, MD; Tucker, MD; Twiselton, MD ?[address], [place], [place] [address] ?[phone] ?Mon-Fri 8:00-5:00, Sat 9:00-12:00 ?Providers come to see babies at [facility] ?Does NOT accept Medicaid ? ?[place] ([address]) ?[facility] ?Limited providers accepting new patients: Brake, NP; Wharton, PA ?[address], [place], [place] [address] ?[phone] ?Mon-Fri 8:00-5:00 ?Babies seen by providers at [facility] ?Does NOT accept Medicaid ?Only accepting babies of parents who are patients ?Please call early in hospitalization for appointment (limited availability) ?[facility] ?Gay, MD; Quinlan, MD ?[address] [place], [place] [address] ?[phone] (press 1 to schedule appointment) ?Mon-Fri 8:00-5:00 ?Providers come to see babies at [facility] ?Does NOT accept Medicaid ?[facility] ?Mazer, MD ?[address] [place], [place] [address] ?[phone] ?Mon-Fri 8:30-5:00 (lunch 12:30-1:00), extended hours by appointment only Wed 5:00-6:30 ?Babies seen by [facility] providers ?Accepting Medicaid ?[facility] ?Banks, MD; Jordan, MD; Koberlein, MD ?[address], [place], [place] [address] ?[phone] ?Mon-Fri 8:00-5:00 ?Babies seen by [facility] providers ?Does NOT accept Medicaid ?[facility] ?Parker, MD; Hunter, MD; Wallace, DO ?[address] [place], [place] [address] ?[phone] ?Mon-Fri 8:00-5:00 ?Babies seen by [facility] providers ?Does NOT accept Medicaid ?[facility] ?Brandon, PA; Brecken, PA; Christy, NP; Dees, MD; DeClaire, MD; DeWeese, MD; Hansen, NP; Mills, NP; Parrish, NP; Smoot, NP; Summer, MD; Vapne,  MD ?[address] [place], [place] [address] ?[phone] ?Mon-Fri 8:30-5:00, Sat 10:00-1:00 ?Providers come to see babies at [facility] ?Does NOT accept Medicaid ?Free prenatal information session Tuesdays at 4:45pm ?[facility] ?Bouska, MD; Gordon, PA; Jeffery, PA; Weber, PA ?[address] [place] [address] ?[phone] ?Mon-Fri 7:30-5:30 ?Babies seen by [facility] providers ?[facility] ?[address], [place], [place]  [address] ?[phone]   Fax - [phone] ? ?[place] ([address]) ?[facility] ?Reese, MD ?[address] [place], [place] [address] ?[phone] ?Mon-Thur 8:00-6:00 ?Providers come to see babies at [facility] ?Accepting Medicaid ?[facility] ?Anderson, NP; Badger, MD; Beal, PA; Spencer, PA ?[address] [place], [place] [address] ?[phone] ?Mon-Thur 7:30-7:30, Fri 7:30-4:30 ?Babies seen by [facility] providers ?Accepting Medicaid ?[facility] ?Agbuya, MD; Klett, NP; Romgoolam, MD ?[address], [place], [place] [address] ?[phone] ?Mon-Fri 8:30-5:00, Sat 8:30-12:00 ?Providers come to see babies at [facility] ?Accepting Medicaid ?Must have ?Meet & Greet? appointment at office prior to delivery ?[facility] ([facility]) ?McCord, MD; Wallace, MD; Wood, MD ?[address], [place], [place] [address] ?[phone] ?Mon-Wed 8:00-6:00, Thur-Fri 8:00-5:00, Sat 9:00-12:00 ?Providers come to see babies at [facility] ?Does NOT accept Medicaid ?Only accepting siblings of current patients ?[facility]  ?[address], [place], [place]  [address] ?[phone]   Fax - [phone] ?[facility] ?[address], [place], [place]  [address] ?[phone]   Fax -  [phone] ? ?[place] ([address]) ?[facility] ?Cirigliano, DO; Matthews, DO ?[address] [place],  [address] ?[phone] ?Mon-Fri 7:00-5:00 ?Babies seen by [place]

## 2021-07-01 ENCOUNTER — Ambulatory Visit (INDEPENDENT_AMBULATORY_CARE_PROVIDER_SITE_OTHER): Payer: Medicaid Other | Admitting: Student

## 2021-07-01 VITALS — BP 119/67 | HR 97 | Wt 154.6 lb

## 2021-07-01 DIAGNOSIS — Z3A33 33 weeks gestation of pregnancy: Secondary | ICD-10-CM

## 2021-07-01 DIAGNOSIS — Z3480 Encounter for supervision of other normal pregnancy, unspecified trimester: Secondary | ICD-10-CM

## 2021-07-01 NOTE — Progress Notes (Signed)
? ?  PRENATAL VISIT NOTE ? ?Subjective:  ?Priscilla Phillips is a 26 y.o. G2P0010 at [redacted]w[redacted]d being seen today for ongoing prenatal care.  She is currently monitored for the following issues for this low-risk pregnancy and has Supervision of other normal pregnancy, antepartum; History of anxiety; and Trichomonas infection on their problem list. ? ?Patient reports  feeling tired .  Contractions: Irritability. Vag. Bleeding: None.  Movement: Present. Denies leaking of fluid.  ? ?The following portions of the patient's history were reviewed and updated as appropriate: allergies, current medications, past family history, past medical history, past social history, past surgical history and problem list.  ? ?Objective:  ? ?Vitals:  ? 07/01/21 1515  ?BP: 119/67  ?Pulse: 97  ?Weight: 154 lb 9.6 oz (70.1 kg)  ? ? ?Fetal Status: Fetal Heart Rate (bpm): 142 Fundal Height: 32 cm Movement: Present    ? ?General:  Alert, oriented and cooperative. Patient is in no acute distress.  ?Skin: Skin is warm and dry. No rash noted.   ?Cardiovascular: Normal heart rate noted  ?Respiratory: Normal respiratory effort, no problems with respiration noted  ?Abdomen: Soft, gravid, appropriate for gestational age.  Pain/Pressure: Present     ?Pelvic: Cervical exam deferred        ?Extremities: Normal range of motion.  Edema: Trace  ?Mental Status: Normal mood and affect. Normal behavior. Normal judgment and thought content.  ? ?Assessment and Plan:  ?Pregnancy: G2P0010 at [redacted]w[redacted]d ?1. Supervision of other normal pregnancy, antepartum ?-doing well other than pelvic pain; comfort measures discussed ?-reviewed that genetic testing was not done accurately she declines today bc she feels reassured by the [place] ?-passed 2 hour GTT ?-she has pediatrician and she will bring name next time.  ?-patient also confirmed she would like OCPs, she has had Depo in the past and wants to switch to OCPS ? ?Preterm labor symptoms and general obstetric precautions including but  not limited to vaginal bleeding, contractions, leaking of fluid and fetal movement were reviewed in detail with the patient. ?Please refer to After Visit Summary for other counseling recommendations.  ? ?Return in about 3 weeks (around 07/22/2021), or LROB. ? ?Future Appointments  ?Date Time Provider Department Center  ?07/23/2021 11:15 AM Marny Lowenstein, PA-C [facility]  ? ? ?Marylene Land, CNM ? ?

## 2021-07-22 ENCOUNTER — Encounter: Payer: Medicaid Other | Admitting: Obstetrics and Gynecology

## 2021-07-23 ENCOUNTER — Other Ambulatory Visit (HOSPITAL_COMMUNITY)
Admission: RE | Admit: 2021-07-23 | Discharge: 2021-07-23 | Disposition: A | Discharge status: Home / Self Care | Payer: Medicaid Other | Source: Ambulatory Visit | Attending: Medical | Admitting: Medical

## 2021-07-23 ENCOUNTER — Encounter: Payer: Self-pay | Admitting: Medical

## 2021-07-23 ENCOUNTER — Ambulatory Visit (INDEPENDENT_AMBULATORY_CARE_PROVIDER_SITE_OTHER): Payer: Medicaid Other | Admitting: Medical

## 2021-07-23 VITALS — BP 118/81 | HR 97 | Wt 155.0 lb

## 2021-07-23 DIAGNOSIS — Z3A36 36 weeks gestation of pregnancy: Secondary | ICD-10-CM | POA: Diagnosis not present

## 2021-07-23 DIAGNOSIS — Z3480 Encounter for supervision of other normal pregnancy, unspecified trimester: Secondary | ICD-10-CM

## 2021-07-23 DIAGNOSIS — Z3493 Encounter for supervision of normal pregnancy, unspecified, third trimester: Secondary | ICD-10-CM | POA: Insufficient documentation

## 2021-07-23 DIAGNOSIS — A599 Trichomoniasis, unspecified: Secondary | ICD-10-CM

## 2021-07-23 LAB — OB RESULTS CONSOLE GC/CHLAMYDIA: Gonorrhea: NEGATIVE

## 2021-07-23 NOTE — Progress Notes (Signed)
? ?  PRENATAL VISIT NOTE ? ?Subjective:  ?Priscilla Phillips is a 26 y.o. G2P0010 at [redacted]w[redacted]d being seen today for ongoing prenatal care.  She is currently monitored for the following issues for this low-risk pregnancy and has Supervision of other normal pregnancy, antepartum; History of anxiety; and Trichomonas infection on their problem list. ? ?Patient reports occasional contractions.  Contractions: Irritability. Vag. Bleeding: None.  Movement: Present. Denies leaking of fluid.  ? ?The following portions of the patient's history were reviewed and updated as appropriate: allergies, current medications, past family history, past medical history, past social history, past surgical history and problem list.  ? ?Objective:  ? ?Vitals:  ? 07/23/21 1047  ?BP: 118/81  ?Pulse: 97  ?Weight: 155 lb (70.3 kg)  ? ? ?Fetal Status: Fetal Heart Rate (bpm): 138 Fundal Height: 37 cm Movement: Present  Presentation: Vertex ? ?General:  Alert, oriented and cooperative. Patient is in no acute distress.  ?Skin: Skin is warm and dry. No rash noted.   ?Cardiovascular: Normal heart rate noted  ?Respiratory: Normal respiratory effort, no problems with respiration noted  ?Abdomen: Soft, gravid, appropriate for gestational age.  Pain/Pressure: Present     ?Pelvic: Cervical exam performed in the presence of a chaperone Dilation: Closed Effacement (%): 20 Station: -3  ?Extremities: Normal range of motion.  Edema: Trace  ?Mental Status: Normal mood and affect. Normal behavior. Normal judgment and thought content.  ? ?Assessment and Plan:  ?Pregnancy: G2P0010 at [redacted]w[redacted]d ?1. [redacted] weeks gestation of pregnancy ?- Culture, beta strep (group b only) ?- Cervicovaginal ancillary only( [place]) ? ?2. Supervision of other normal pregnancy, antepartum ? ?3. Trichomonas infection ?- TOC negative previously  ? ?Preterm labor symptoms and general obstetric precautions including but not limited to vaginal bleeding, contractions, leaking of fluid and fetal  movement were reviewed in detail with the patient. ?Please refer to After Visit Summary for other counseling recommendations.  ? ?Return in about 1 week (around 07/30/2021) for LOB, In-Person. ? ?Future Appointments  ?Date Time Provider [place]  ?07/30/2021  8:35 AM Luvenia Redden, PA-C [facility]  ?08/09/2021 11:15 AM Danielle Rankin [facility]  ?08/16/2021  1:15 PM Tresea Mall, CNM [facility]  ?08/25/2021  8:15 AM [facility]  ?08/25/2021  9:15 AM Gabriel Carina, CNM [facility] [place]  ? ? ?Kerry Hough, PA-C ? ?

## 2021-07-23 NOTE — Progress Notes (Signed)
Patient reports daily contractions but does not time them  ?

## 2021-07-27 LAB — CERVICOVAGINAL ANCILLARY ONLY
Chlamydia: NEGATIVE
Comment: NEGATIVE
Comment: NORMAL
Neisseria Gonorrhea: NEGATIVE

## 2021-07-27 LAB — CULTURE, BETA STREP (GROUP B ONLY): Strep Gp B Culture: NEGATIVE

## 2021-07-30 ENCOUNTER — Ambulatory Visit (INDEPENDENT_AMBULATORY_CARE_PROVIDER_SITE_OTHER): Payer: Medicaid Other | Admitting: Medical

## 2021-07-30 VITALS — BP 114/81 | HR 88 | Wt 153.0 lb

## 2021-07-30 DIAGNOSIS — Z3480 Encounter for supervision of other normal pregnancy, unspecified trimester: Secondary | ICD-10-CM

## 2021-07-30 DIAGNOSIS — Z3A37 37 weeks gestation of pregnancy: Secondary | ICD-10-CM

## 2021-07-30 NOTE — Progress Notes (Signed)
? ?  PRENATAL VISIT NOTE ? ?Subjective:  ?Priscilla Phillips is a 26 y.o. G2P0010 at [redacted]w[redacted]d being seen today for ongoing prenatal care.  She is currently monitored for the following issues for this low-risk pregnancy and has Supervision of other normal pregnancy, antepartum; History of anxiety; and Trichomonas infection on their problem list. ? ?Patient reports fatigue.  Contractions: Irritability. Vag. Bleeding: None.  Movement: Present. Denies leaking of fluid.  ? ?The following portions of the patient's history were reviewed and updated as appropriate: allergies, current medications, past family history, past medical history, past social history, past surgical history and problem list.  ? ?Objective:  ? ?Vitals:  ? 07/30/21 0855  ?BP: 114/81  ?Pulse: 88  ?Weight: 153 lb (69.4 kg)  ? ? ?Fetal Status: Fetal Heart Rate (bpm): 135   Movement: Present  Presentation: Vertex ? ?General:  Alert, oriented and cooperative. Patient is in no acute distress.  ?Skin: Skin is warm and dry. No rash noted.   ?Cardiovascular: Normal heart rate noted  ?Respiratory: Normal respiratory effort, no problems with respiration noted  ?Abdomen: Soft, gravid, appropriate for gestational age.  Pain/Pressure: Present     ?Pelvic: Cervical exam performed in the presence of a chaperone Dilation: Closed Effacement (%): 20 Station: -3  ?Extremities: Normal range of motion.  Edema: None  ?Mental Status: Normal mood and affect. Normal behavior. Normal judgment and thought content.  ? ?Assessment and Plan:  ?Pregnancy: G2P0010 at [redacted]w[redacted]d ?1. Supervision of other normal pregnancy, antepartum ?- Doing well ?- Has chosen Peds, will confirm name  ?- Negative GBS discussed  ? ?2. [redacted] weeks gestation of pregnancy ? ?Preterm labor symptoms and general obstetric precautions including but not limited to vaginal bleeding, contractions, leaking of fluid and fetal movement were reviewed in detail with the patient. ?Please refer to After Visit Summary for other  counseling recommendations.  ? ?Return in about 1 week (around 08/06/2021) for LOB, In-Person. ? ?Future Appointments  ?Date Time Provider Department Center  ?08/09/2021 11:15 AM Kathlene Cote [facility]  ?08/16/2021  1:15 PM Armando Reichert, CNM [facility]  ?08/25/2021  8:15 AM [facility]  ?08/25/2021  9:15 AM Bernerd Limbo, CNM [facility]  ? ? ?Vonzella Nipple, PA-C ? ?

## 2021-08-05 ENCOUNTER — Telehealth: Payer: Self-pay | Admitting: Family Medicine

## 2021-08-05 NOTE — Telephone Encounter (Signed)
Called patient to move appointment time, there was no answer to the phone call so a voicemail was left with the call back number for the office.  ?

## 2021-08-07 ENCOUNTER — Inpatient Hospital Stay (EMERGENCY_DEPARTMENT_HOSPITAL)
Admission: AD | Admit: 2021-08-07 | Discharge: 2021-08-07 | Disposition: A | Discharge status: Home / Self Care | Payer: Medicaid Other | Source: Home / Self Care | Attending: Obstetrics & Gynecology | Admitting: Obstetrics & Gynecology

## 2021-08-07 ENCOUNTER — Inpatient Hospital Stay (HOSPITAL_BASED_OUTPATIENT_CLINIC_OR_DEPARTMENT_OTHER): Payer: Medicaid Other

## 2021-08-07 ENCOUNTER — Encounter (HOSPITAL_COMMUNITY): Payer: Self-pay | Admitting: Obstetrics & Gynecology

## 2021-08-07 ENCOUNTER — Other Ambulatory Visit: Payer: Self-pay

## 2021-08-07 ENCOUNTER — Inpatient Hospital Stay (HOSPITAL_COMMUNITY)
Admission: AD | Admit: 2021-08-07 | Discharge: 2021-08-12 | DRG: 787 | Disposition: A | Discharge status: Home / Self Care | Payer: Medicaid Other | Attending: Obstetrics and Gynecology | Admitting: Obstetrics and Gynecology

## 2021-08-07 DIAGNOSIS — O471 False labor at or after 37 completed weeks of gestation: Secondary | ICD-10-CM

## 2021-08-07 DIAGNOSIS — Z3A38 38 weeks gestation of pregnancy: Secondary | ICD-10-CM

## 2021-08-07 DIAGNOSIS — O99344 Other mental disorders complicating childbirth: Secondary | ICD-10-CM | POA: Diagnosis not present

## 2021-08-07 DIAGNOSIS — O26893 Other specified pregnancy related conditions, third trimester: Secondary | ICD-10-CM | POA: Insufficient documentation

## 2021-08-07 DIAGNOSIS — O99891 Other specified diseases and conditions complicating pregnancy: Secondary | ICD-10-CM

## 2021-08-07 DIAGNOSIS — Z98891 History of uterine scar from previous surgery: Secondary | ICD-10-CM

## 2021-08-07 DIAGNOSIS — Z3480 Encounter for supervision of other normal pregnancy, unspecified trimester: Secondary | ICD-10-CM

## 2021-08-07 DIAGNOSIS — D62 Acute posthemorrhagic anemia: Secondary | ICD-10-CM | POA: Diagnosis not present

## 2021-08-07 DIAGNOSIS — O9081 Anemia of the puerperium: Secondary | ICD-10-CM | POA: Diagnosis not present

## 2021-08-07 DIAGNOSIS — O289 Unspecified abnormal findings on antenatal screening of mother: Secondary | ICD-10-CM | POA: Diagnosis not present

## 2021-08-07 DIAGNOSIS — F419 Anxiety disorder, unspecified: Secondary | ICD-10-CM | POA: Diagnosis not present

## 2021-08-07 DIAGNOSIS — O9902 Anemia complicating childbirth: Secondary | ICD-10-CM | POA: Diagnosis not present

## 2021-08-07 LAB — CBC
HCT: 39.3 % (ref 36.0–46.0)
Hemoglobin: 12.8 g/dL (ref 12.0–15.0)
MCH: 30.3 pg (ref 26.0–34.0)
MCHC: 32.6 g/dL (ref 30.0–36.0)
MCV: 93.1 fL (ref 80.0–100.0)
Platelets: 174 10*3/uL (ref 150–400)
RBC: 4.22 MIL/uL (ref 3.87–5.11)
RDW: 13.2 % (ref 11.5–15.5)
WBC: 12.8 10*3/uL — ABNORMAL HIGH (ref 4.0–10.5)
nRBC: 0 % (ref 0.0–0.2)

## 2021-08-07 LAB — TYPE AND SCREEN
ABO/RH(D): A POS
Antibody Screen: NEGATIVE

## 2021-08-07 MED ORDER — TERBUTALINE SULFATE 1 MG/ML IJ SOLN: 0.2500 mg | Freq: Once | INTRAMUSCULAR | Status: DC | PRN

## 2021-08-07 MED ORDER — LACTATED RINGERS IV SOLN: INTRAVENOUS | Status: DC

## 2021-08-07 MED ORDER — OXYTOCIN-SODIUM CHLORIDE 30-0.9 UT/500ML-% IV SOLN
2.5000 [IU]/h | INTRAVENOUS | Status: DC
  Administered 2021-08-09: 30 [IU] via INTRAVENOUS

## 2021-08-07 MED ORDER — SOD CITRATE-CITRIC ACID 500-334 MG/5ML PO SOLN
30.0000 mL | ORAL | Status: DC | PRN
  Filled 2021-08-07: qty 30

## 2021-08-07 MED ORDER — OXYCODONE-ACETAMINOPHEN 5-325 MG PO TABS: 2.0000 | ORAL_TABLET | ORAL | Status: DC | PRN

## 2021-08-07 MED ORDER — OXYCODONE-ACETAMINOPHEN 5-325 MG PO TABS: 1.0000 | ORAL_TABLET | ORAL | Status: DC | PRN

## 2021-08-07 MED ORDER — ACETAMINOPHEN 325 MG PO TABS: 650.0000 mg | ORAL_TABLET | ORAL | Status: DC | PRN

## 2021-08-07 MED ORDER — ONDANSETRON HCL 4 MG/2ML IJ SOLN
4.0000 mg | Freq: Four times a day (QID) | INTRAMUSCULAR | Status: DC | PRN
  Administered 2021-08-09: 4 mg via INTRAVENOUS

## 2021-08-07 MED ORDER — ZOLPIDEM TARTRATE 5 MG PO TABS
5.0000 mg | ORAL_TABLET | Freq: Once | ORAL | Status: AC
  Administered 2021-08-07: 5 mg via ORAL
  Filled 2021-08-07: qty 1

## 2021-08-07 MED ORDER — ACETAMINOPHEN 500 MG PO TABS
1000.0000 mg | ORAL_TABLET | Freq: Once | ORAL | Status: AC
  Administered 2021-08-07: 1000 mg via ORAL
  Filled 2021-08-07: qty 2

## 2021-08-07 MED ORDER — LACTATED RINGERS IV BOLUS
1000.0000 mL | Freq: Once | INTRAVENOUS | Status: AC
Start: 2021-08-07 — End: 2021-08-07
  Administered 2021-08-07: 1000 mL via INTRAVENOUS

## 2021-08-07 MED ORDER — OXYTOCIN BOLUS FROM INFUSION: 333.0000 mL | Freq: Once | INTRAVENOUS | Status: DC

## 2021-08-07 MED ORDER — OXYCODONE HCL 5 MG PO TABS
5.0000 mg | ORAL_TABLET | Freq: Once | ORAL | Status: AC
  Administered 2021-08-07: 5 mg via ORAL
  Filled 2021-08-07: qty 1

## 2021-08-07 MED ORDER — LIDOCAINE HCL (PF) 1 % IJ SOLN: 30.0000 mL | INTRAMUSCULAR | Status: DC | PRN

## 2021-08-07 MED ORDER — LACTATED RINGERS IV SOLN
500.0000 mL | INTRAVENOUS | Status: DC | PRN
  Administered 2021-08-07: 500 mL via INTRAVENOUS
  Administered 2021-08-08: 1000 mL via INTRAVENOUS

## 2021-08-07 MED ORDER — OXYTOCIN-SODIUM CHLORIDE 30-0.9 UT/500ML-% IV SOLN
1.0000 m[IU]/min | INTRAVENOUS | Status: DC
  Administered 2021-08-07 – 2021-08-08 (×2): 1 m[IU]/min via INTRAVENOUS
  Filled 2021-08-07: qty 500

## 2021-08-07 NOTE — MAU Provider Note (Signed)
?History  ?  ? ?CSN: [id number] ? ?Arrival date and time: 08/07/21 0855 ? ?  ?Chief Complaint  ?Patient presents with  ? Contractions  ? ?HPI ?Priscilla Phillips is a 26 y.o. G2P0010 at [redacted]w[redacted]d who presents for a labor check. She reports contractions every minute since last night. She denies any bleeding or leaking. She reports normal fetal movement.  ? ?OB History   ? ? Gravida  ?2  ? Para  ?   ? Term  ?   ? Preterm  ?   ? AB  ?1  ? Living  ?   ?  ? ? SAB  ?1  ? IAB  ?   ? Ectopic  ?   ? Multiple  ?   ? Live Births  ?   ?   ?  ?  ? ? ?Past Medical History:  ?Diagnosis Date  ? Anxiety   ? Ovarian cyst   ? Trichomonas infection   ? UTI (urinary tract infection)   ? ? ?Past Surgical History:  ?Procedure Laterality Date  ? NO PAST SURGERIES    ? ? ?Family History  ?Problem Relation Age of Onset  ? Depression Mother   ? Cancer Mother   ?     skin  ? Healthy Father   ? Diabetes Maternal Grandmother   ? Hypertension Maternal Grandmother   ? ? ?Social History  ? ?Tobacco Use  ? Smoking status: Never  ? Smokeless tobacco: Never  ?Vaping Use  ? Vaping Use: Former  ?Substance Use Topics  ? Alcohol use: Never  ? Drug use: Not Currently  ?  Types: Marijuana  ?  Comment: July 2022  ? ? ?Allergies: No Known Allergies ? ?Medications Prior to Admission  ?Medication Sig Dispense Refill Last Dose  ? Blood Pressure Monitoring (BLOOD PRESSURE MONITOR AUTOMAT) DEVI 1 Device by Does not apply route daily. Automatic blood pressure cuff regular size. To monitor blood pressure regularly at home. ICD-10 code:Z34.90 (Patient not taking: Reported on 07/23/2021) 1 each 0   ? Prenatal Vit-Fe Fumarate-FA (PRENATAL VITAMIN) 27-0.8 MG TABS Take 1 tablet by mouth daily. 30 tablet 11 08/06/2021  ? ? ?Review of Systems  ?Constitutional: Negative.  Negative for fatigue and fever.  ?HENT: Negative.    ?Respiratory: Negative.  Negative for shortness of breath.   ?Cardiovascular: Negative.  Negative for chest pain.  ?Gastrointestinal:  Positive for abdominal  pain. Negative for constipation, diarrhea, nausea and vomiting.  ?Genitourinary: Negative.  Negative for dysuria, vaginal bleeding and vaginal discharge.  ?Neurological: Negative.  Negative for dizziness and headaches.  ?Physical Exam  ? ?Blood pressure 120/79, pulse (!) 106, temperature 97.8 ?F (36.6 ?C), temperature source Oral, resp. rate 18, last menstrual period 11/10/2020, SpO2 99 %. ? ?Physical Exam ?Vitals and nursing note reviewed.  ?Constitutional:   ?   General: She is not in acute distress. ?   Appearance: She is well-developed.  ?HENT:  ?   Head: Normocephalic.  ?Eyes:  ?   Pupils: Pupils are equal, round, and reactive to light.  ?Cardiovascular:  ?   Rate and Rhythm: Normal rate and regular rhythm.  ?   Heart sounds: Normal heart sounds.  ?Pulmonary:  ?   Effort: Pulmonary effort is normal. No respiratory distress.  ?   Breath sounds: Normal breath sounds.  ?Abdominal:  ?   General: Bowel sounds are normal. There is no distension.  ?   Palpations: Abdomen is soft.  ?   Tenderness:  There is no abdominal tenderness.  ?Skin: ?   General: Skin is warm and dry.  ?Neurological:  ?   Mental Status: She is alert and oriented to person, place, and time.  ?Psychiatric:     ?   Mood and Affect: Mood normal.     ?   Behavior: Behavior normal.     ?   Thought Content: Thought content normal.     ?   Judgment: Judgment normal.  ? ?Fetal Tracing: ? ?Baseline: 145 ?Variability: moderate ?Accels: 10x10 ?Decels: variable ? ?Toco:UI with occasional uc's ? ?Dilation: Closed ?Exam by:: C. Neil,CNM ? ? ?MAU Course  ?Procedures ? ?MDM ?NST- reassuring but not reactive with one variable ?Consulted with Dr. Charlotta Newton given NST and 1 elevated BP today- recommends BPP ?Dr. Charlotta Newton notified of repeat reassuring but non reactive NST and BPP 8/8 with normal AFI- MD ok to discharge home since patient has close follow up on Monday ? ?Plan of care reviewed with patient. Patient requesting something to help her sleep. Ambien  given. ? ?Assessment and Plan  ? ?1. False labor after 37 completed weeks of gestation   ?2. [redacted] weeks gestation of pregnancy   ? ?-Discharge home in stable condition ?-Labor precautions discussed ?-Patient advised to follow-up with OB as scheduled for prenatal care ?-Patient may return to MAU as needed or if her condition were to change or worsen ? ? ?Rolm Bookbinder CNM ?08/07/2021, 2:41 PM  ?

## 2021-08-07 NOTE — MAU Note (Signed)
Patient arrived to MAU complaining of contractions that started 1am. Patient  stated that contractions became consistent at 6am. ? ?No vaginal bleeding and or leakage of fluid. + FM noted.  ?

## 2021-08-07 NOTE — Discharge Instructions (Signed)
MILES CIRCUIT for helping baby rotate into a better position and engage in pelvis ?

## 2021-08-07 NOTE — MAU Note (Signed)
.  Priscilla Phillips is a 26 y.o. at [redacted]w[redacted]d here in MAU reporting: returned for painful contractions. Patient states they are still as far apart as they were earlier but they are 10/10 and she cannot tolerate labor at home. Denies VB or LOF. +FM. Laurel Dimmer but she states she threw up after she left here.  ? ?Pain score: 10 ?  ?FHT:145 ? ?

## 2021-08-07 NOTE — H&P (Signed)
OBSTETRIC ADMISSION HISTORY AND PHYSICAL ? ?Priscilla Phillips is a 26 y.o. female G2P0010 with IUP at [redacted]w[redacted]d by LMP presenting for IOL in the setting of BPP 6/8 with occasional variables on FHT (initially non-reactive, but reactive now). She reports +FMs, No LOF, no VB, no blurry vision, headaches or peripheral edema, and RUQ pain.  She plans on breast feeding. She requests pills for birth control. ?She received her prenatal care at  [facility]   ? ?Dating: By LMP --->  Estimated Date of Delivery: 08/17/21 ? ?Sono:   ?Per chart review last known [place] was at [redacted]w[redacted]d, cephalic presentation  with placenta at posterior fundus and EFW of 263g. ? ? ?Prenatal History/Complications:  ?-Trich infection during pregnancy, TOC negative ? ?Past Medical History: ?Past Medical History:  ?Diagnosis Date  ? Anxiety   ? Ovarian cyst   ? Trichomonas infection   ? UTI (urinary tract infection)   ? ? ?Past Surgical History: ?Past Surgical History:  ?Procedure Laterality Date  ? NO PAST SURGERIES    ? ? ?Obstetrical History: ?OB History   ? ? Gravida  ?2  ? Para  ?   ? Term  ?   ? Preterm  ?   ? AB  ?1  ? Living  ?   ?  ? ? SAB  ?1  ? IAB  ?   ? Ectopic  ?   ? Multiple  ?   ? Live Births  ?   ?   ?  ?  ? ? ?Social History ?Social History  ? ?Socioeconomic History  ? Marital status: Single  ?  Spouse name: Minerva Areola  ? Number of children: 0  ? Years of education: Not on file  ? Highest education level: Some college, no degree  ?Occupational History  ? Occupation: Clinical biochemist  ?Tobacco Use  ? Smoking status: Never  ? Smokeless tobacco: Never  ?Vaping Use  ? Vaping Use: Former  ?Substance and Sexual Activity  ? Alcohol use: Never  ? Drug use: Not Currently  ?  Types: Marijuana  ?  Comment: July 2022  ? Sexual activity: Yes  ?  Birth control/protection: None  ?Other Topics Concern  ? Not on file  ?Social History Narrative  ? Not on file  ? ?Social Determinants of Health  ? ?Financial Resource Strain: Not on file  ?Food Insecurity: Food Insecurity Present   ? Worried About Programme researcher, broadcasting/film/video in the Last Year: Sometimes true  ? Ran Out of Food in the Last Year: Sometimes true  ?Transportation Needs: Unmet Transportation Needs  ? Lack of Transportation (Medical): No  ? Lack of Transportation (Non-Medical): Yes  ?Physical Activity: Not on file  ?Stress: Not on file  ?Social Connections: Not on file  ? ? ?Family History: ?Family History  ?Problem Relation Age of Onset  ? Depression Mother   ? Cancer Mother   ?     skin  ? Healthy Father   ? Diabetes Maternal Grandmother   ? Hypertension Maternal Grandmother   ? ? ?Allergies: ?No Known Allergies ? ?Pt denies allergies to latex, iodine, or shellfish. ? ?Medications Prior to Admission  ?Medication Sig Dispense Refill Last Dose  ? Blood Pressure Monitoring (BLOOD PRESSURE MONITOR AUTOMAT) DEVI 1 Device by Does not apply route daily. Automatic blood pressure cuff regular size. To monitor blood pressure regularly at home. ICD-10 code:Z34.90 (Patient not taking: Reported on 07/23/2021) 1 each 0   ? Prenatal Vit-Fe Fumarate-FA (PRENATAL VITAMIN) 27-0.8  MG TABS Take 1 tablet by mouth daily. 30 tablet 11   ? ? ? ?Review of Systems  ? ?All systems reviewed and negative except as stated in HPI ? ?Blood pressure (!) 110/57, pulse 79, temperature 98.4 ?F (36.9 ?C), temperature source Oral, resp. rate 16, last menstrual period 11/10/2020, SpO2 99 %. ?General appearance: alert and cooperative ?Lungs: Normal WOB ?Heart: regular rate  ?Abdomen: soft, non-tender ?Pelvic: NEFG ?Extremities: Homans sign is negative, no sign of DVT ?Presentation: cephalic ?Fetal monitoringBaseline: 145 bpm, Variability: Good {> 6 bpm), Accelerations: Reactive, and Decelerations: occasional variable ?Uterine activity q10 or more  ?Dilation: Closed ?Exam by:: Leafy Ro, RN ? ? ?Prenatal labs: ?ABO, Rh: --/--/A POS (05/13 1735) ?Antibody: NEG (05/13 1735) ?Rubella: 1.53 (11/07 1547) ?RPR: Non Reactive (02/23 0858)  ?HBsAg: Negative (11/07 1547)  ?HIV: Non  Reactive (02/23 0858)  ?GBS: Negative/-- (04/28 1118)  ?1 hr Glucola: Normal ?Genetic screening:  Not done, incorrect sample ?Anatomy US: Normal ? ?Prenatal Transfer Tool  ?Maternal Diabetes: No ?Genetic Screening: Declined ?Maternal Ultrasounds/Referrals: Normal ?Fetal Ultrasounds or other Referrals:  None ?Maternal Substance Abuse:  No ?Significant Maternal Medications:  None ?Significant Maternal Lab Results: Group B Strep negative ? ?Results for orders placed or performed during the hospital encounter of 08/07/21 (from the past 24 hour(s))  ?Type and screen [facility]  ? Collection Time: 08/07/21  5:35 PM  ?Result Value Ref Range  ? ABO/RH(D) A POS   ? Antibody Screen NEG   ? Sample Expiration    ?  08/10/2021,2359 ?Performed at [facility], [address], [place], [place] [address] ?  ? ? ?Patient Active Problem List  ? Diagnosis Date Noted  ? Normal labor 08/07/2021  ? Trichomonas infection 02/14/2021  ? History of anxiety 02/12/2021  ? Supervision of other normal pregnancy, antepartum 02/01/2021  ? ? ?Assessment/Plan:  ?Priscilla Phillips is a 26 y.o. G2P0010 at [redacted]w[redacted]d here for IOL d/t BPP 6/8.  ? ?#Labor: Intermittent Cat II Strip and cervix still closed despite painful contractions for the past almost 24 hours. Plan to start with low dose pit 1x1 until 8 to hopefully open cervix enough for FB placement. Do miles circuit to hopefully help with positioning and ineffective painful ctx.  ?#Pain: PRN ?#FWB: Cat I currently  ?#ID: GBS Negative  ?#MOF: Breast ?#MOC: pills  ?#Circ:  NA ? ?Allayne Stack, DO  ?Center for [employer], [facility] ?08/07/2021, 8:28 PM ? ?  ?

## 2021-08-08 ENCOUNTER — Inpatient Hospital Stay (HOSPITAL_COMMUNITY): Payer: Medicaid Other | Admitting: Anesthesiology

## 2021-08-08 LAB — RPR: RPR Ser Ql: NONREACTIVE

## 2021-08-08 MED ORDER — FENTANYL-BUPIVACAINE-NACL 0.5-0.125-0.9 MG/250ML-% EP SOLN
EPIDURAL | Status: DC | PRN
  Administered 2021-08-08: 12 mL/h via EPIDURAL

## 2021-08-08 MED ORDER — LACTATED RINGERS AMNIOINFUSION: INTRAVENOUS | Status: DC

## 2021-08-08 MED ORDER — LIDOCAINE HCL (PF) 1 % IJ SOLN
INTRAMUSCULAR | Status: DC | PRN
  Administered 2021-08-08: 10 mL via EPIDURAL
  Administered 2021-08-08: 2 mL via EPIDURAL

## 2021-08-08 MED ORDER — LACTATED RINGERS IV SOLN: 500.0000 mL | Freq: Once | INTRAVENOUS | Status: DC

## 2021-08-08 MED ORDER — PHENYLEPHRINE 80 MCG/ML (10ML) SYRINGE FOR IV PUSH (FOR BLOOD PRESSURE SUPPORT)
80.0000 ug | PREFILLED_SYRINGE | INTRAVENOUS | Status: DC | PRN
  Administered 2021-08-08: 160 ug via INTRAVENOUS
  Administered 2021-08-08: 80 ug via INTRAVENOUS

## 2021-08-08 MED ORDER — FENTANYL CITRATE (PF) 100 MCG/2ML IJ SOLN
100.0000 ug | INTRAMUSCULAR | Status: DC | PRN
  Administered 2021-08-08 (×5): 100 ug via INTRAVENOUS
  Filled 2021-08-08 (×7): qty 2

## 2021-08-08 MED ORDER — BISACODYL 10 MG RE SUPP
10.0000 mg | Freq: Once | RECTAL | Status: AC
  Administered 2021-08-08: 10 mg via RECTAL
  Filled 2021-08-08: qty 1

## 2021-08-08 MED ORDER — FENTANYL-BUPIVACAINE-NACL 0.5-0.125-0.9 MG/250ML-% EP SOLN
12.0000 mL/h | EPIDURAL | Status: DC | PRN
  Filled 2021-08-08: qty 250

## 2021-08-08 MED ORDER — EPHEDRINE 5 MG/ML INJ: 10.0000 mg | INTRAVENOUS | Status: DC | PRN

## 2021-08-08 MED ORDER — SENNOSIDES-DOCUSATE SODIUM 8.6-50 MG PO TABS
1.0000 | ORAL_TABLET | Freq: Every day | ORAL | Status: DC
  Filled 2021-08-08: qty 1

## 2021-08-08 MED ORDER — DIPHENHYDRAMINE HCL 50 MG/ML IJ SOLN: 12.5000 mg | INTRAMUSCULAR | Status: DC | PRN

## 2021-08-08 MED ORDER — PHENYLEPHRINE 80 MCG/ML (10ML) SYRINGE FOR IV PUSH (FOR BLOOD PRESSURE SUPPORT)
80.0000 ug | PREFILLED_SYRINGE | INTRAVENOUS | Status: DC | PRN
  Filled 2021-08-08: qty 10

## 2021-08-08 MED ORDER — MISOPROSTOL 50MCG HALF TABLET
50.0000 ug | ORAL_TABLET | ORAL | Status: DC | PRN
  Administered 2021-08-08: 50 ug via BUCCAL
  Filled 2021-08-08: qty 1

## 2021-08-08 MED ORDER — TERBUTALINE SULFATE 1 MG/ML IJ SOLN
0.2500 mg | Freq: Once | INTRAMUSCULAR | Status: AC | PRN
  Administered 2021-08-09: 0.25 mg via SUBCUTANEOUS
  Filled 2021-08-08: qty 1

## 2021-08-08 NOTE — Anesthesia Procedure Notes (Signed)
Epidural ?Patient location during procedure: OB ?Start time: 08/08/2021 10:21 PM ?End time: 08/08/2021 10:31 PM ? ?Staffing ?Anesthesiologist: Lannie Fields, DO ?Performed: anesthesiologist  ? ?Preanesthetic Checklist ?Completed: patient identified, IV checked, risks and benefits discussed, monitors and equipment checked, pre-op evaluation and timeout performed ? ?Epidural ?Patient position: sitting ?Prep: DuraPrep and site prepped and draped ?Patient monitoring: continuous pulse ox, blood pressure, heart rate and cardiac monitor ?Approach: midline ?Location: L3-L4 ?Injection technique: LOR air ? ?Needle:  ?Needle type: Tuohy  ?Needle gauge: 17 G ?Needle length: 9 cm ?Needle insertion depth: 4.5 cm ?Catheter type: closed end flexible ?Catheter size: 19 Gauge ?Catheter at skin depth: 10 cm ?Test dose: negative ? ?Assessment ?Sensory level: T8 ?Events: blood not aspirated, injection not painful, no injection resistance, no paresthesia and negative IV test ? ?Additional Notes ?Patient identified. Risks/Benefits/Options discussed with patient including but not limited to bleeding, infection, nerve damage, paralysis, failed block, incomplete pain control, headache, blood pressure changes, nausea, vomiting, reactions to medication both or allergic, itching and postpartum back pain. Confirmed with bedside nurse the patient's most recent platelet count. Confirmed with patient that they are not currently taking any anticoagulation, have any bleeding history or any family history of bleeding disorders. Patient expressed understanding and wished to proceed. All questions were answered. Sterile technique was used throughout the entire procedure. Please see nursing notes for vital signs. Test dose was given through epidural catheter and negative prior to continuing to dose epidural or start infusion. Warning signs of high block given to the patient including shortness of breath, tingling/numbness in hands, complete motor  block, or any concerning symptoms with instructions to call for help. Patient was given instructions on fall risk and not to get out of bed. All questions and concerns addressed with instructions to call with any issues or inadequate analgesia.  Reason for block:procedure for pain ? ? ? ?

## 2021-08-08 NOTE — Progress Notes (Signed)
Labor Progress Note ?Priscilla Phillips is a 26 y.o. G2P0010 at [redacted]w[redacted]d presented for IOL for non-reassuring fetal testing 6/8 BPP ?S:  ?Patient reports having cramping.  ? ?O:  ?BP 113/70   Pulse 83   Temp 98.2 ?F (36.8 ?C) (Oral)   Resp (P) 17   Ht 4\' 11"  (1.499 m)   Wt 69.4 kg   LMP 11/10/2020 (Exact Date)   SpO2 98%   BMI 30.90 kg/m?  ?EFM: 140/moderate/accels/no decels ? ?CVE: Dilation: Fingertip ?Effacement (%): Thick ?Station: -3 ?Presentation: Vertex ?Exam by:: Dr. [email or web address] ? ? ?A&P: 26 y.o. G2P0010 at [redacted]w[redacted]d presented for IOL for non-reassuring fetal testing 6/8 BPP ?#Labor: Cooks still in place. Contractions only every 20-68min. Will plan to restart pitocin 1x1 at this time and if tolerated by fetus will stop pitocin and do buccal cytotec 31m ?#Pain: IV pain medications ?#FWB: Cat I at this time ?#GBS negative ? ? , MD, MPH ?OB Fellow, Faculty Practice ? ? ?

## 2021-08-08 NOTE — Progress Notes (Signed)
Labor Progress Note ?Priscilla Phillips is a 26 y.o. G2P0010 at [redacted]w[redacted]d presented for IOL for non-reassuring fetal testing (BPP 6/8) ? ?Of note, patient did not get buccal cytotec at ~3PM because she wanted to shower. S/p shower and now back in bed.  ? ?S:  ?Reports feels better after shower. Discussed checking in order to assess balloon since its been in place for almost 12 hours now. Initially hesitant however discussed that it would be helpful for making further plan ? ?O:  ?BP 113/70   Pulse 83   Temp 98.2 ?F (36.8 ?C) (Oral)   Resp 17   Ht 4\' 11"  (1.499 m)   Wt 69.4 kg   LMP 11/10/2020 (Exact Date)   SpO2 98%   BMI 30.90 kg/m?  ?EFM: 140/moderate variability/+accels/no decels ? ?CVE: Dilation: 1.5 ?Effacement (%): Thick ?Station: -3 ?Presentation: Vertex ?Exam by:: [email or web address] MD ? ?Assessed around balloon. Balloon still in place and behind external os. Feel balloon right behind os and there is no tunnelling or cervical thickness between balloon and os. Cervix ~1.5 cm. Patient very uncomfortable with exam and therefore limited exam. ? ?A&P: 26 y.o.G2P0010 at [redacted]w[redacted]d presented for IOL for non-reassuring fetal testing (BPP 6/8) ?#Labor: Patient previously on pitocin and then balloon placed this morning. Balloon still in place and cervix ~1.5 cm although challenging and limited exam. Plan was to stop pitocin and give a dose of cytotec this afternoon however patient wanted break and shower. Now s/p shower and will get cytotec 50 mcg buccal. Discussed that she can get epidural at any point if IV pain medications or nitrous are not enough. Patient expressed understanding but is hesitant of epidural. Reports she is scared of needles.  ?#FWB: Cat I throughout shift ? ?8/8, MD ?5:38 PM  ?

## 2021-08-08 NOTE — Progress Notes (Signed)
Assessed tracing  ? ?Cat I with accels and no decels. Will plan to stop pitocin and give dose of buccal cytotec 57mcg for further ripening. ? ?Renard Matter, MD, MPH ?OB Fellow, Faculty Practice ? ?

## 2021-08-08 NOTE — Progress Notes (Signed)
Labor Progress Note ?Priscilla Phillips is a 26 y.o. G2P0010 at [redacted]w[redacted]d who presented for IOL due to non-reassuring fetal testing (BPP 6/8).  ? ?S: Getting a little bit more comfortable after epidural. Still feeling pressure. Family at bedside. Called to bedside by RN due to variable decels.  ? ?O:  ?BP 116/73   Pulse 81   Temp 98.2 ?F (36.8 ?C) (Oral)   Resp 16   Ht 4\' 11"  (1.499 m)   Wt 69.4 kg   LMP 11/10/2020 (Exact Date)   SpO2 99%   BMI 30.90 kg/m?  ? ?EFM: Baseline 150 bpm, moderate variability, no accels, variable decels  ? ?CVE: Dilation: 6 ?Effacement (%): 80 ?Station: 0 ?Presentation: Vertex ?Exam by:: Dr. [email or web address] ? ?A&P: 26 y.o. G2P0010 [redacted]w[redacted]d  ? ?#Labor: Progressing well. Bulging bag noted on exam. AROM performed with clear fluid. IUPC placed and amnioinfusion started due to variable decelerations with contractions. Will work on position changes and reassess over the next 30 min to see if decels improve. Discussed plan of care with Dr. [redacted]w[redacted]d who is in agreement with plan of care. ?#Pain: Epidural  ?#FWB: Cat 2 ?#GBS negative ? ?Alysia Penna, MD ?11:38 PM ? ?

## 2021-08-08 NOTE — Progress Notes (Signed)
Labor Progress Note ?Priscilla Phillips is a 26 y.o. G2P0010 at [redacted]w[redacted]d presented for IOL due to [facility] 6/8 ? ?S: Feeling very uncomfortable with contractions.  ? ?O:  ?BP (!) 124/99   Pulse 83   Temp 98.3 ?F (36.8 ?C) (Oral)   Resp 16   Ht 4\' 11"  (1.499 m)   Wt 69.4 kg   LMP 11/10/2020 (Exact Date)   SpO2 100%   BMI 30.90 kg/m?  ?EFM: 150/mod/15x15/none ? ?CVE: Dilation: Fingertip ?Effacement (%): Thick ?Station: -3 ?Presentation: Vertex ?Exam by:: Dr. Higinio Plan ? ? ?A&P: 26 y.o. G2P0010 [redacted]w[redacted]d  ?#Labor: Difficulty assessing cervix with manual exam due to patient discomfort. Opted to try speculum exam with then successful cooks FB placement with about 80cc. Checked manually afterwards to confirm appropriate placement. Will stop pit for now and allow mechanical dilation, can add back later.   ?#Pain: Nitrous/fent currently, epidural later  ?#FWB: Cat I  ?#GBS negative ? ? ?Patriciaann Clan, DO ?7:02 AM  ?

## 2021-08-08 NOTE — Anesthesia Preprocedure Evaluation (Signed)
Anesthesia Evaluation  ?Patient identified by MRN, date of birth, ID band ?Patient awake ? ? ? ?Reviewed: ?Allergy & Precautions, Patient's Chart, lab work & pertinent test results ? ?Airway ?Mallampati: II ? ?TM Distance: >3 FB ?Neck ROM: Full ? ? ? Dental ?no notable dental hx. ? ?  ?Pulmonary ?neg pulmonary ROS,  ?  ?Pulmonary exam normal ?breath sounds clear to auscultation ? ? ? ? ? ? Cardiovascular ?negative cardio ROS ?Normal cardiovascular exam ?Rhythm:Regular Rate:Normal ? ? ?  ?Neuro/Psych ?PSYCHIATRIC DISORDERS Anxiety negative neurological ROS ?   ? GI/Hepatic ?negative GI ROS, Neg liver ROS,   ?Endo/Other  ?negative endocrine ROS ? Renal/GU ?negative Renal ROS  ?negative genitourinary ?  ?Musculoskeletal ?negative musculoskeletal ROS ?(+)  ? Abdominal ?  ?Peds ?negative pediatric ROS ?(+)  Hematology ?negative hematology ROS ?(+) Hb 12.8, plt 174   ?Anesthesia Other Findings ? ? Reproductive/Obstetrics ?(+) Pregnancy ? ?  ? ? ? ? ? ? ? ? ? ? ? ? ? ?  ?  ? ? ? ? ? ? ? ? ?Anesthesia Physical ?Anesthesia Plan ? ?ASA: 2 ? ?Anesthesia Plan: Epidural  ? ?Post-op Pain Management:   ? ?Induction:  ? ?PONV Risk Score and Plan: 2 ? ?Airway Management Planned: Natural Airway ? ?Additional Equipment: None ? ?Intra-op Plan:  ? ?Post-operative Plan:  ? ?Informed Consent: I have reviewed the patients History and Physical, chart, labs and discussed the procedure including the risks, benefits and alternatives for the proposed anesthesia with the patient or authorized representative who has indicated his/her understanding and acceptance.  ? ? ? ? ? ?Plan Discussed with:  ? ?Anesthesia Plan Comments:   ? ? ? ? ? ? ?Anesthesia Quick Evaluation ? ?

## 2021-08-09 ENCOUNTER — Encounter: Payer: Medicaid Other | Admitting: Medical

## 2021-08-09 ENCOUNTER — Other Ambulatory Visit: Payer: Self-pay

## 2021-08-09 ENCOUNTER — Encounter (HOSPITAL_COMMUNITY): Payer: Self-pay | Admitting: Obstetrics & Gynecology

## 2021-08-09 ENCOUNTER — Encounter (HOSPITAL_COMMUNITY)
Admission: AD | Disposition: A | Discharge status: Home / Self Care | Payer: Self-pay | Source: Home / Self Care | Attending: Obstetrics and Gynecology

## 2021-08-09 DIAGNOSIS — Z3A38 38 weeks gestation of pregnancy: Secondary | ICD-10-CM

## 2021-08-09 DIAGNOSIS — O9902 Anemia complicating childbirth: Secondary | ICD-10-CM

## 2021-08-09 DIAGNOSIS — Z98891 History of uterine scar from previous surgery: Secondary | ICD-10-CM

## 2021-08-09 SURGERY — Surgical Case
Anesthesia: Epidural | Site: Abdomen | Wound class: Clean Contaminated

## 2021-08-09 MED ORDER — WITCH HAZEL-GLYCERIN EX PADS: 1.0000 "application " | MEDICATED_PAD | CUTANEOUS | Status: DC | PRN

## 2021-08-09 MED ORDER — OXYTOCIN-SODIUM CHLORIDE 30-0.9 UT/500ML-% IV SOLN: 2.5000 [IU]/h | INTRAVENOUS | Status: AC

## 2021-08-09 MED ORDER — DEXAMETHASONE SODIUM PHOSPHATE 10 MG/ML IJ SOLN
INTRAMUSCULAR | Status: AC
  Filled 2021-08-09: qty 1

## 2021-08-09 MED ORDER — COCONUT OIL OIL: 1.0000 "application " | TOPICAL_OIL | Status: DC | PRN

## 2021-08-09 MED ORDER — ONDANSETRON HCL 4 MG/2ML IJ SOLN: 4.0000 mg | Freq: Three times a day (TID) | INTRAMUSCULAR | Status: DC | PRN

## 2021-08-09 MED ORDER — DEXMEDETOMIDINE (PRECEDEX) IN NS 20 MCG/5ML (4 MCG/ML) IV SYRINGE
PREFILLED_SYRINGE | INTRAVENOUS | Status: DC | PRN
  Administered 2021-08-09: 8 ug via INTRAVENOUS

## 2021-08-09 MED ORDER — KETOROLAC TROMETHAMINE 30 MG/ML IJ SOLN
30.0000 mg | Freq: Four times a day (QID) | INTRAMUSCULAR | Status: AC
  Administered 2021-08-09 (×4): 30 mg via INTRAVENOUS
  Filled 2021-08-09 (×4): qty 1

## 2021-08-09 MED ORDER — SIMETHICONE 80 MG PO CHEW
80.0000 mg | CHEWABLE_TABLET | Freq: Three times a day (TID) | ORAL | Status: DC
  Administered 2021-08-09 – 2021-08-11 (×6): 80 mg via ORAL
  Filled 2021-08-09 (×6): qty 1

## 2021-08-09 MED ORDER — DIPHENHYDRAMINE HCL 25 MG PO CAPS: 25.0000 mg | ORAL_CAPSULE | ORAL | Status: DC | PRN

## 2021-08-09 MED ORDER — ACETAMINOPHEN 500 MG PO TABS
1000.0000 mg | ORAL_TABLET | Freq: Four times a day (QID) | ORAL | Status: AC
  Administered 2021-08-09 (×3): 1000 mg via ORAL
  Filled 2021-08-09 (×3): qty 2

## 2021-08-09 MED ORDER — SODIUM CHLORIDE 0.9 % IV SOLN
500.0000 mg | INTRAVENOUS | Status: AC
  Administered 2021-08-09: 500 mg via INTRAVENOUS

## 2021-08-09 MED ORDER — CEFAZOLIN SODIUM-DEXTROSE 2-3 GM-%(50ML) IV SOLR
INTRAVENOUS | Status: DC | PRN
  Administered 2021-08-09: 2 g via INTRAVENOUS

## 2021-08-09 MED ORDER — MEPERIDINE HCL 25 MG/ML IJ SOLN: 6.2500 mg | INTRAMUSCULAR | Status: DC | PRN

## 2021-08-09 MED ORDER — HYDROMORPHONE HCL 1 MG/ML IJ SOLN: 0.2500 mg | INTRAMUSCULAR | Status: DC | PRN

## 2021-08-09 MED ORDER — OXYCODONE HCL 5 MG PO TABS: 5.0000 mg | ORAL_TABLET | Freq: Once | ORAL | Status: DC | PRN

## 2021-08-09 MED ORDER — IBUPROFEN 600 MG PO TABS
600.0000 mg | ORAL_TABLET | Freq: Four times a day (QID) | ORAL | Status: DC
  Administered 2021-08-10 – 2021-08-12 (×8): 600 mg via ORAL
  Filled 2021-08-09 (×9): qty 1

## 2021-08-09 MED ORDER — OXYCODONE HCL 5 MG/5ML PO SOLN: 5.0000 mg | Freq: Once | ORAL | Status: DC | PRN

## 2021-08-09 MED ORDER — MORPHINE SULFATE (PF) 0.5 MG/ML IJ SOLN
INTRAMUSCULAR | Status: AC
  Filled 2021-08-09: qty 10

## 2021-08-09 MED ORDER — SODIUM CHLORIDE 0.9 % IR SOLN
Status: DC | PRN
  Administered 2021-08-09: 600 mL

## 2021-08-09 MED ORDER — STERILE WATER FOR IRRIGATION IR SOLN
Status: DC | PRN
  Administered 2021-08-09: 1

## 2021-08-09 MED ORDER — ONDANSETRON HCL 4 MG/2ML IJ SOLN: 4.0000 mg | Freq: Once | INTRAMUSCULAR | Status: DC | PRN

## 2021-08-09 MED ORDER — DIBUCAINE (PERIANAL) 1 % EX OINT: 1.0000 "application " | TOPICAL_OINTMENT | CUTANEOUS | Status: DC | PRN

## 2021-08-09 MED ORDER — FENTANYL CITRATE (PF) 100 MCG/2ML IJ SOLN
INTRAMUSCULAR | Status: DC | PRN
Start: 2021-08-09 — End: 2021-08-09
  Administered 2021-08-09: 100 ug via EPIDURAL

## 2021-08-09 MED ORDER — SCOPOLAMINE 1 MG/3DAYS TD PT72
1.0000 | MEDICATED_PATCH | Freq: Once | TRANSDERMAL | Status: AC
  Administered 2021-08-09: 1.5 mg via TRANSDERMAL

## 2021-08-09 MED ORDER — TRANEXAMIC ACID-NACL 1000-0.7 MG/100ML-% IV SOLN
1000.0000 mg | INTRAVENOUS | Status: AC
  Administered 2021-08-09: 1000 mg via INTRAVENOUS

## 2021-08-09 MED ORDER — DIPHENHYDRAMINE HCL 50 MG/ML IJ SOLN
12.5000 mg | INTRAMUSCULAR | Status: DC | PRN
  Administered 2021-08-09: 12.5 mg via INTRAVENOUS
  Filled 2021-08-09: qty 1

## 2021-08-09 MED ORDER — SCOPOLAMINE 1 MG/3DAYS TD PT72
MEDICATED_PATCH | TRANSDERMAL | Status: AC
  Filled 2021-08-09: qty 1

## 2021-08-09 MED ORDER — TRANEXAMIC ACID-NACL 1000-0.7 MG/100ML-% IV SOLN
INTRAVENOUS | Status: AC
  Filled 2021-08-09: qty 100

## 2021-08-09 MED ORDER — MENTHOL 3 MG MT LOZG: 1.0000 | LOZENGE | OROMUCOSAL | Status: DC | PRN

## 2021-08-09 MED ORDER — ACETAMINOPHEN 10 MG/ML IV SOLN: 1000.0000 mg | Freq: Once | INTRAVENOUS | Status: DC

## 2021-08-09 MED ORDER — KETOROLAC TROMETHAMINE 30 MG/ML IJ SOLN: 30.0000 mg | Freq: Four times a day (QID) | INTRAMUSCULAR | Status: DC | PRN

## 2021-08-09 MED ORDER — KETOROLAC TROMETHAMINE 30 MG/ML IJ SOLN
INTRAMUSCULAR | Status: AC
  Filled 2021-08-09: qty 1

## 2021-08-09 MED ORDER — LIDOCAINE-EPINEPHRINE (PF) 2 %-1:200000 IJ SOLN
INTRAMUSCULAR | Status: AC
  Filled 2021-08-09: qty 20

## 2021-08-09 MED ORDER — KETOROLAC TROMETHAMINE 30 MG/ML IJ SOLN: 30.0000 mg | Freq: Once | INTRAMUSCULAR | Status: DC | PRN

## 2021-08-09 MED ORDER — ENOXAPARIN SODIUM 40 MG/0.4ML IJ SOSY
40.0000 mg | PREFILLED_SYRINGE | INTRAMUSCULAR | Status: DC
  Administered 2021-08-09 – 2021-08-10 (×2): 40 mg via SUBCUTANEOUS
  Filled 2021-08-09 (×2): qty 0.4

## 2021-08-09 MED ORDER — PRENATAL MULTIVITAMIN CH
1.0000 | ORAL_TABLET | Freq: Every day | ORAL | Status: DC
  Administered 2021-08-09 – 2021-08-10 (×2): 1 via ORAL
  Filled 2021-08-09 (×3): qty 1

## 2021-08-09 MED ORDER — SENNOSIDES-DOCUSATE SODIUM 8.6-50 MG PO TABS
2.0000 | ORAL_TABLET | Freq: Every day | ORAL | Status: DC
  Administered 2021-08-10: 2 via ORAL
  Filled 2021-08-09 (×2): qty 2

## 2021-08-09 MED ORDER — SOD CITRATE-CITRIC ACID 500-334 MG/5ML PO SOLN: 30.0000 mL | ORAL | Status: DC

## 2021-08-09 MED ORDER — KETOROLAC TROMETHAMINE 30 MG/ML IJ SOLN
INTRAMUSCULAR | Status: DC | PRN
Start: 2021-08-09 — End: 2021-08-09
  Administered 2021-08-09: 30 mg via INTRAVENOUS

## 2021-08-09 MED ORDER — CEFAZOLIN SODIUM-DEXTROSE 2-4 GM/100ML-% IV SOLN: 2.0000 g | INTRAVENOUS | Status: DC

## 2021-08-09 MED ORDER — FENTANYL CITRATE (PF) 100 MCG/2ML IJ SOLN
INTRAMUSCULAR | Status: AC
  Filled 2021-08-09: qty 2

## 2021-08-09 MED ORDER — OXYTOCIN-SODIUM CHLORIDE 30-0.9 UT/500ML-% IV SOLN
INTRAVENOUS | Status: AC
Start: 2021-08-09 — End: ?
  Filled 2021-08-09: qty 500

## 2021-08-09 MED ORDER — OXYCODONE HCL 5 MG PO TABS
5.0000 mg | ORAL_TABLET | ORAL | Status: DC | PRN
  Administered 2021-08-10 – 2021-08-11 (×3): 5 mg via ORAL
  Filled 2021-08-09 (×3): qty 1

## 2021-08-09 MED ORDER — ACETAMINOPHEN 10 MG/ML IV SOLN
INTRAVENOUS | Status: DC | PRN
  Administered 2021-08-09: 1000 mg via INTRAVENOUS

## 2021-08-09 MED ORDER — LIDOCAINE-EPINEPHRINE (PF) 2 %-1:200000 IJ SOLN
INTRAMUSCULAR | Status: DC | PRN
  Administered 2021-08-09: 8 mL via EPIDURAL

## 2021-08-09 MED ORDER — DEXAMETHASONE SODIUM PHOSPHATE 10 MG/ML IJ SOLN
INTRAMUSCULAR | Status: DC | PRN
  Administered 2021-08-09: 10 mg via INTRAVENOUS

## 2021-08-09 MED ORDER — SODIUM CHLORIDE 0.9% FLUSH: 3.0000 mL | INTRAVENOUS | Status: DC | PRN

## 2021-08-09 MED ORDER — NALOXONE HCL 0.4 MG/ML IJ SOLN: 0.4000 mg | INTRAMUSCULAR | Status: DC | PRN

## 2021-08-09 MED ORDER — DEXMEDETOMIDINE (PRECEDEX) IN NS 20 MCG/5ML (4 MCG/ML) IV SYRINGE
PREFILLED_SYRINGE | INTRAVENOUS | Status: AC
  Filled 2021-08-09: qty 5

## 2021-08-09 MED ORDER — ONDANSETRON HCL 4 MG/2ML IJ SOLN
INTRAMUSCULAR | Status: AC
  Filled 2021-08-09: qty 2

## 2021-08-09 MED ORDER — CEFAZOLIN SODIUM-DEXTROSE 2-4 GM/100ML-% IV SOLN
INTRAVENOUS | Status: AC
  Filled 2021-08-09: qty 100

## 2021-08-09 MED ORDER — AMISULPRIDE (ANTIEMETIC) 5 MG/2ML IV SOLN: 10.0000 mg | Freq: Once | INTRAVENOUS | Status: DC | PRN

## 2021-08-09 MED ORDER — DIPHENHYDRAMINE HCL 25 MG PO CAPS: 25.0000 mg | ORAL_CAPSULE | Freq: Four times a day (QID) | ORAL | Status: DC | PRN

## 2021-08-09 MED ORDER — SIMETHICONE 80 MG PO CHEW: 80.0000 mg | CHEWABLE_TABLET | ORAL | Status: DC | PRN

## 2021-08-09 MED ORDER — NALOXONE HCL 4 MG/10ML IJ SOLN
1.0000 ug/kg/h | INTRAVENOUS | Status: DC | PRN
  Filled 2021-08-09: qty 5

## 2021-08-09 SURGICAL SUPPLY — 40 items
APL SKNCLS STERI-STRIP NONHPOA (GAUZE/BANDAGES/DRESSINGS) ×1
BENZOIN TINCTURE PRP APPL 2/3 (GAUZE/BANDAGES/DRESSINGS) ×2 IMPLANT
CHLORAPREP W/TINT 26ML (MISCELLANEOUS) ×4 IMPLANT
CLAMP CORD UMBIL (MISCELLANEOUS) ×2 IMPLANT
CLOSURE STERI STRIP 1/2 X4 (GAUZE/BANDAGES/DRESSINGS) ×1 IMPLANT
CLOTH BEACON ORANGE TIMEOUT ST (SAFETY) ×2 IMPLANT
DRAPE C SECTION CLR SCREEN (DRAPES) IMPLANT
DRSG OPSITE POSTOP 4X10 (GAUZE/BANDAGES/DRESSINGS) ×2 IMPLANT
ELECT REM PT RETURN 9FT ADLT (ELECTROSURGICAL) ×2
ELECTRODE REM PT RTRN 9FT ADLT (ELECTROSURGICAL) ×1 IMPLANT
EXTRACTOR VACUUM M CUP 4 TUBE (SUCTIONS) IMPLANT
GLOVE BIO SURGEON STRL SZ7.5 (GLOVE) ×2 IMPLANT
GLOVE BIOGEL PI IND STRL 7.0 (GLOVE) ×2 IMPLANT
GLOVE BIOGEL PI INDICATOR 7.0 (GLOVE) ×2
GOWN STRL REUS W/TWL 2XL LVL3 (GOWN DISPOSABLE) ×2 IMPLANT
GOWN STRL REUS W/TWL LRG LVL3 (GOWN DISPOSABLE) ×4 IMPLANT
KIT ABG SYR 3ML LUER SLIP (SYRINGE) IMPLANT
NDL HYPO 25X5/8 SAFETYGLIDE (NEEDLE) IMPLANT
NEEDLE HYPO 22GX1.5 SAFETY (NEEDLE) ×2 IMPLANT
NEEDLE HYPO 25X5/8 SAFETYGLIDE (NEEDLE) IMPLANT
NS IRRIG 1000ML POUR BTL (IV SOLUTION) ×2 IMPLANT
PACK C SECTION WH (CUSTOM PROCEDURE TRAY) ×2 IMPLANT
PAD OB MATERNITY 4.3X12.25 (PERSONAL CARE ITEMS) ×2 IMPLANT
RTRCTR C-SECT PINK 25CM LRG (MISCELLANEOUS) ×2 IMPLANT
STRIP CLOSURE SKIN 1/2X4 (GAUZE/BANDAGES/DRESSINGS) ×2 IMPLANT
SUT CHROMIC 1 CTX 36 (SUTURE) ×4 IMPLANT
SUT PLAIN 0 NONE (SUTURE) IMPLANT
SUT VIC AB 1 CT1 36 (SUTURE) ×2 IMPLANT
SUT VIC AB 2-0 CT1 (SUTURE) ×2 IMPLANT
SUT VIC AB 2-0 CT1 27 (SUTURE) ×2
SUT VIC AB 2-0 CT1 TAPERPNT 27 (SUTURE) ×1 IMPLANT
SUT VIC AB 3-0 CT1 27 (SUTURE) ×2
SUT VIC AB 3-0 CT1 TAPERPNT 27 (SUTURE) ×1 IMPLANT
SUT VIC AB 3-0 SH 27 (SUTURE)
SUT VIC AB 3-0 SH 27X BRD (SUTURE) IMPLANT
SUT VIC AB 4-0 KS 27 (SUTURE) ×2 IMPLANT
SYR BULB IRRIGATION 50ML (SYRINGE) IMPLANT
TOWEL OR 17X24 6PK STRL BLUE (TOWEL DISPOSABLE) ×2 IMPLANT
TRAY FOLEY W/BAG SLVR 14FR LF (SET/KITS/TRAYS/PACK) ×2 IMPLANT
WATER STERILE IRR 1000ML POUR (IV SOLUTION) ×2 IMPLANT

## 2021-08-09 NOTE — Anesthesia Postprocedure Evaluation (Signed)
Anesthesia Post Note ? ?Patient: Priscilla Phillips ? ?Procedure(s) Performed: CESAREAN SECTION (Abdomen) ? ?  ? ?Patient location during evaluation: PACU ?Anesthesia Type: Epidural ?Level of consciousness: awake and alert and oriented ?Pain management: pain level controlled ?Vital Signs Assessment: post-procedure vital signs reviewed and stable ?Respiratory status: spontaneous breathing, nonlabored ventilation and respiratory function stable ?Cardiovascular status: blood pressure returned to baseline and stable ?Postop Assessment: no headache, no backache, epidural receding and no apparent nausea or vomiting ?Anesthetic complications: no ? ? ?No notable events documented. ? ?Last Vitals:  ?Vitals:  ? 08/09/21 0338 08/09/21 0438  ?BP: 117/75 115/76  ?Pulse: 81 82  ?Resp: 18 18  ?Temp: 36.9 ?C 36.7 ?C  ?SpO2:  98%  ?  ?Last Pain:  ?Vitals:  ? 08/09/21 0542  ?TempSrc:   ?PainSc: 0-No pain  ? ?Pain Goal:   ? ?  ?  ?  ?  ?  ?  ?Epidural/Spinal Function Cutaneous sensation: Vague (08/09/21 0542), Patient able to flex knees: Yes (08/09/21 0542), Patient able to lift hips off bed: Yes (08/09/21 0542), Back pain beyond tenderness at insertion site: No (08/09/21 0542), Progressively worsening motor and/or sensory loss: No (08/09/21 0542), Bowel and/or bladder incontinence post epidural: No (08/09/21 0542) ? ?Tennis Must [name] ? ? ? ? ?

## 2021-08-09 NOTE — Transfer of Care (Signed)
Immediate Anesthesia Transfer of Care Note ? ?Patient: Priscilla Phillips ? ?Procedure(s) Performed: CESAREAN SECTION (Abdomen) ? ?Patient Location: PACU ? ?Anesthesia Type:Epidural ? ?Level of Consciousness: awake, alert , oriented and patient cooperative ? ?Airway & Oxygen Therapy: Patient Spontanous Breathing ? ?Post-op Assessment: Report given to RN and Post -op Vital signs reviewed and stable ? ?Post vital signs: Reviewed and stable ? ?Last Vitals:  ?Vitals Value Taken Time  ?BP 105/61 08/09/21 0231  ?Temp    ?Pulse 107 08/09/21 0234  ?Resp 23 08/09/21 0234  ?SpO2 98 % 08/09/21 0234  ?Vitals shown include unvalidated device data. ? ?Last Pain:  ?Vitals:  ? 08/09/21 0000  ?TempSrc: Axillary  ?PainSc:   ?   ? ?  ? ?Complications: No notable events documented. ?

## 2021-08-09 NOTE — Op Note (Signed)
Priscilla Phillips ? ?PROCEDURE DATE: 08/09/2021 ? ?PREOPERATIVE DIAGNOSES: Intrauterine pregnancy at 109w6d weeks gestation; non-reassuring fetal status ? ?POSTOPERATIVE DIAGNOSES: The same ? ?PROCEDURE: Primary Low Transverse Cesarean Section ? ?SURGEON:  Dr. Nettie Elm  ? ?ASSISTANT:  Dr. Evalina Field  ? ?ANESTHESIOLOGY TEAM: Anesthesiologist: Lannie Fields, DO ?CRNA: Orlie Pollen, CRNA ? ?INDICATIONS: Priscilla Phillips is a 26 y.o. G2P1011 at [redacted]w[redacted]d here for cesarean section secondary to the indications listed under preoperative diagnoses; please see preoperative note for further details.  The risks of cesarean section were discussed with the patient including but were not limited to: bleeding which may require transfusion or reoperation; infection which may require antibiotics; injury to bowel, bladder, ureters or other surrounding organs; injury to the fetus; need for additional procedures including hysterectomy in the event of a life-threatening hemorrhage; placental abnormalities wth subsequent pregnancies, incisional problems, thromboembolic phenomenon and other postoperative/anesthesia complications.   The patient concurred with the proposed plan, giving informed written consent for the procedure.   ? ?FINDINGS:  Viable female infant in cephalic presentation.  Apgars 7 and 8.  Clear amniotic fluid.  Intact placenta, three vessel cord.  Normal uterus.  ? ?ANESTHESIA: Epidural  ?INTRAVENOUS FLUIDS: 1200 ml   ?ESTIMATED BLOOD LOSS: 250 ml ?URINE OUTPUT:  100 ml ?SPECIMENS: Placenta sent to L&D  ?COMPLICATIONS: None immediate ? ?PROCEDURE IN DETAIL:   ?The patient preoperatively received intravenous antibiotics and had sequential compression devices applied to her lower extremities.  She was then taken to the operating room where the epidural anesthesia was dosed up to surgical level and was found to be adequate. She was then placed in a dorsal supine position with a leftward tilt, and prepped and  draped in a sterile manner.  A foley catheter was placed into her bladder and attached to constant gravity.   ? ?After an adequate timeout was performed, a Pfannenstiel skin incision was made with a scalpel and carried through to the underlying layer of fascia. The fascia was incised in the midline, and this incision was extended bilaterally bluntly.  The rectus muscles were separated in the midline and the peritoneum was entered bluntly. The Alexis self-retaining retractor was introduced into the abdominal cavity.   ? ?Attention was turned to the lower uterine segment where a low transverse hysterotomy was made with a scalpel and extended bilaterally bluntly.  The infant was successfully delivered, the cord was clamped and cut after 30 seconds, and the infant was handed over to the awaiting neonatology team. Uterine massage was then administered, and the placenta delivered intact with a three-vessel cord. The uterus was then cleared of clots and debris.   ? ?The hysterotomy was closed with 0 Chromic in a running locked fashion, and an imbricating layer was also placed with 0 Chromic.  Figure-of-eight 0 Chromic serosal stitches were placed to help with hemostasis.  The pelvis was cleared of all clot and debris. Hemostasis was confirmed on all surfaces.  The retractor was removed.   ? ?The peritoneum and rectus muscles were reapproximated using a 2-0 Vicryl running stitch. The fascia was then closed using 0 Vicryl in a running fashion.  The subcutaneous layer was irrigated, re-approximated with a 3-0 Vicryl running stitch, and the skin was closed with a 4-0 Vicryl subcuticular stitch. The patient tolerated the procedure well. Sponge, instrument and needle counts were correct x 3.  She was taken to the recovery room in stable condition.  ? ?Evalina Field, MD ?[facility] Fellow  Faculty  Practice  ? ? ?

## 2021-08-09 NOTE — Social Work (Addendum)
[name] was referred for history of depression/anxiety. ? ?* Referral screened out by Clinical Social Worker because none of the following criteria appear to apply: ? ?~ History of anxiety/depression during this pregnancy, or of post-partum depression following prior delivery. Per OB notes 02/12/21, [name] denies history of mental health disorders including anxiety and/or depression.  ? ?~ Diagnosis of anxiety and/or depression within last 3 years.  ?OR ?* [name]'s symptoms currently being treated with medication and/or therapy. ? ?Please contact the Clinical Social Worker if needs arise, by [name] request, or if [name] scores greater than 9/yes to question 10 on Edinburgh Postpartum Depression Screen.  ? ?Jarrod Bodkins, MSW, LCSW ?[facility]  ?Clinical Social Worker  ?[phone] ?08/09/2021  10:11 AM  ?

## 2021-08-09 NOTE — Discharge Summary (Signed)
Postpartum Discharge Summary   Patient Name: Priscilla Phillips DOB: Aug 16, 1995 MRN: [id number]  Date of admission: 08/07/2021 Delivery date:08/09/2021  Delivering provider: Chancy Milroy  Date of discharge: 08/12/2021  Admitting diagnosis: Normal labor [O80, Z37.9] Intrauterine pregnancy: [redacted]w[redacted]d    Secondary diagnosis:  Principal Problem:   S/P cesarean section Active Problems:   Supervision of other normal pregnancy, antepartum   Normal labor  Additional problems: Acute blood loss anemia, mild     Discharge diagnosis: Term Pregnancy Delivered                                              Post partum procedures: None Augmentation: AROM, Pitocin, Cytotec, and IP Foley Complications: None  Hospital course: Induction of Labor With Cesarean Section   26y.o. yo G2P0010 at 366w6das admitted to the hospital 08/07/2021 for induction of labor due to BPP of 6/8. Patient had a labor course that started with low dose Pitocin followed by Cooks balloon placement. Pitocin was later discontinued and she received a dose of Cytotec before the balloon dislodged. Patient then started to have recurrent variable decelerations. AROM performed and IUPC placed to start amnioinfusion without improvement. Patient also given terbutaline without improvement. The patient went for cesarean section due to Non-Reassuring FHR remote from delivery. Delivery details are as follows: Membrane Rupture Time/Date: 11:16 PM ,08/08/2021   Delivery Method:C-Section, Low Transverse  Details of operation can be found in separate operative note.  Patient had an uncomplicated postpartum course.  Her hemoglobin on POD#1 was 10.9.  She is ambulating, tolerating a regular diet, passing flatus, and urinating well.  Her pain and bleeding are controlled.  She is formula feeding well.  Patient is discharged home in stable condition on 08/12/21.      Newborn Data: Birth date:08/09/2021  Birth time:1:46 AM  Gender:Female  Living  status:Living  Apgars:7 ,8  Weight:2750 g                                Magnesium Sulfate received: No BMZ received: No Rhophylac: N/A MMR: N/A T-DaP: Given prenatally Flu: No Transfusion: No  Physical exam  Vitals:   08/11/21 0544 08/11/21 1300 08/11/21 2016 08/12/21 0515  BP: 115/74 106/67 112/78 115/75  Pulse: 60 91 89 72  Resp: 18     Temp: 98.2 F (36.8 C) 98 F (36.7 C)    TempSrc: Oral Oral    SpO2: 100% 99%    Weight:      Height:       General: alert, cooperative, and no distress Lochia: appropriate Uterine Fundus: firm and below umbilicus  Incision: healing well with no significant drainage, no significant erythema, dressing is clean, dry, and intact DVT Evaluation: no LE edema or calf tenderness to palpation   Labs: Lab Results  Component Value Date   WBC 16.3 (H) 08/10/2021   HGB 10.9 (L) 08/10/2021   HCT 33.9 (L) 08/10/2021   MCV 92.9 08/10/2021   PLT 175 08/10/2021      Latest Ref Rng & Units 08/10/2021    8:40 AM  CMP  Creatinine 0.44 - 1.00 mg/dL 0.66     Edinburgh Score:    08/09/2021    7:40 AM  Edinburgh Postnatal Depression Scale Screening Tool  I have been  able to laugh and see the funny side of things. 0  I have looked forward with enjoyment to things. 0  I have blamed myself unnecessarily when things went wrong. 1  I have been anxious or worried for no good reason. 1  I have felt scared or panicky for no good reason. 2  Things have been getting on top of me. 0  I have been so unhappy that I have had difficulty sleeping. 0  I have felt sad or miserable. 0  I have been so unhappy that I have been crying. 0  The thought of harming myself has occurred to me. 0  Edinburgh Postnatal Depression Scale Total 4     After visit meds:  Allergies as of 08/12/2021   No Known Allergies      Medication List     STOP taking these medications    Blood Pressure Monitor Automat Devi       TAKE these medications    acetaminophen 500  MG tablet Commonly known as: TYLENOL Take 2 tablets (1,000 mg total) by mouth every 8 (eight) hours as needed (pain).   ibuprofen 600 MG tablet Commonly known as: ADVIL Take 1 tablet (600 mg total) by mouth every 6 (six) hours as needed (pain).   oxyCODONE 5 MG immediate release tablet Commonly known as: Oxy IR/ROXICODONE Take 1 tablet (5 mg total) by mouth every 6 (six) hours as needed for breakthrough pain or severe pain.   Prenatal Vitamin 27-0.8 MG Tabs Take 1 tablet by mouth daily.               Discharge Care Instructions  (From admission, onward)           Start     Ordered   08/12/21 0000  Discharge wound care:       Comments: Remove dressing 5 days after your surgery date. You can then wash the area gently with soap and water and pat dry. You will have an incision check in about 1 week.   08/12/21 0728             Discharge home in stable condition Infant Feeding: Breast Infant Disposition: rooming in Discharge instruction: per After Visit Summary and Postpartum booklet. Activity: Advance as tolerated. Pelvic rest for 6 weeks.  Diet: routine diet Future Appointments: Future Appointments  Date Time Provider [place]  08/16/2021  3:00 PM [facility] NURSE [facility]  09/10/2021 11:15 AM Danielle Rankin [facility]   Follow up Visit: Message sent to [facility] by Dr. Gwenlyn Perking on 08/09/21.  Please schedule this patient for a In person postpartum visit in 6 weeks with the following provider: Any provider. Additional Postpartum F/U: Incision check 1 week  Low risk pregnancy complicated by:  None Delivery mode:  C-Section, Low Transverse  Anticipated Birth Control:  OCPs  08/12/2021 Genia Del, MD

## 2021-08-09 NOTE — Progress Notes (Signed)
Assessed patient at bedside due to persistent variable decelerations with contractions. Contracting every 3-5 minutes with coupling. Has had amnioinfusion running for 1 hr. Discussed with Dr. Alysia Penna, who recommended terbutaline to allow for additional fetal recovery. Terbutaline then given. Patient proceeded to have a prolonged deceleration to the 70s for approximately 6 minutes. After recovery, continued to have variable decelerations. Recommended cesarean section at this time due to NRFHT remote from delivery.  ? ?The risks of surgery were discussed with the patient including but were not limited to: bleeding which may require transfusion or reoperation; infection which may require antibiotics; injury to bowel, bladder, ureters or other surrounding organs; injury to the fetus; need for additional procedures including hysterectomy in the event of a life-threatening hemorrhage; formation of adhesions; placental abnormalities with subsequent pregnancies; incisional problems; thromboembolic phenomenon and other postoperative/anesthesia complications.  The patient concurred with the proposed plan, giving informed written consent for the procedure.   Patient has been on clear liquid diet, will remain NPO for procedure. Anesthesia and OR aware. Preoperative prophylactic antibiotics and SCDs ordered on call to the OR.  To OR when ready. ? ?Evalina Field, MD  ? ?

## 2021-08-10 LAB — CBC
HCT: 33.9 % — ABNORMAL LOW (ref 36.0–46.0)
Hemoglobin: 10.9 g/dL — ABNORMAL LOW (ref 12.0–15.0)
MCH: 29.9 pg (ref 26.0–34.0)
MCHC: 32.2 g/dL (ref 30.0–36.0)
MCV: 92.9 fL (ref 80.0–100.0)
Platelets: 175 10*3/uL (ref 150–400)
RBC: 3.65 MIL/uL — ABNORMAL LOW (ref 3.87–5.11)
RDW: 13.3 % (ref 11.5–15.5)
WBC: 16.3 10*3/uL — ABNORMAL HIGH (ref 4.0–10.5)
nRBC: 0 % (ref 0.0–0.2)

## 2021-08-10 LAB — BIRTH TISSUE RECOVERY COLLECTION (PLACENTA DONATION)

## 2021-08-10 LAB — CREATININE, SERUM
Creatinine, Ser: 0.66 mg/dL (ref 0.44–1.00)
GFR, Estimated: >60 mL/min (ref >60)

## 2021-08-10 NOTE — Progress Notes (Signed)
POSTPARTUM PROGRESS NOTE ? ?POD #1 ? ?Subjective: ? ?Priscilla Phillips is a 26 y.o. G2P1011 s/p pLTCS at [redacted]w[redacted]d. No acute events overnight. She reports she is doing well. She denies any problems with ambulating, voiding or po intake. Denies nausea or vomiting. She has passed flatus. Pain is well controlled.  Lochia is mild. ? ?Objective: ?Blood pressure 105/73, pulse 65, temperature 97.6 ?F (36.4 ?C), temperature source Oral, resp. rate 16, height 4\' 11"  (1.499 m), weight 69.4 kg, last menstrual period 11/10/2020, SpO2 99 %, unknown if currently breastfeeding. ? ?Physical Exam:  ?General: alert, cooperative and no distress ?Chest: no respiratory distress ?Heart: regular rate, distal pulses intact ?Uterine Fundus: firm, appropriately tender ?DVT Evaluation: No calf swelling or tenderness ?Extremities: minimal edema ?Skin: warm, dry; incision clean/dry/intact w/ honeycomb dressing in place (small area of dried saturation in the center) ? ?Recent Labs  ?  08/07/21 ?1735 08/10/21 ?0840  ?HGB 12.8 10.9*  ?HCT 39.3 33.9*  ? ? ?Assessment/Plan: ?Priscilla Phillips is a 26 y.o. G2P1011 s/p PLTCS at [redacted]w[redacted]d for NRFHT. ? ?POD#1 - Doing welll; pain controlled. H/H appropriate  ? Routine postpartum care ? OOB, ambulated ? Lovenox for VTE prophylaxis ? ?Consented for neonatal circ today, deferred by pediatrics for feeding, hopefully for tomorrow.  ? ?Contraception: pills  ?Feeding:  breast/formula  ? ?Dispo: Plan for discharge tomorrow (pt preference) or following day pending clinical status. ? ? LOS: 3 days  ? ?[redacted]w[redacted]d, DO ?OB Fellow  ?08/10/2021, 9:22 AM  ?

## 2021-08-11 MED ORDER — ACETAMINOPHEN 325 MG PO TABS
650.0000 mg | ORAL_TABLET | ORAL | Status: DC | PRN
  Administered 2021-08-11: 650 mg via ORAL
  Filled 2021-08-11 (×2): qty 2

## 2021-08-11 NOTE — Progress Notes (Signed)
Post Operative Day 2 ? ?Subjective: ?Doing well. No acute events overnight. Pain is controlled and bleeding is appropriate. She is eating, drinking, voiding, and ambulating without issue. She is formula feeding which is going well. She has no other concerns at this time. ? ?Objective: ?Blood pressure 115/74, pulse 60, temperature 98.2 ?F (36.8 ?C), temperature source Oral, resp. rate 18, height 4\' 11"  (1.499 m), weight 69.4 kg, last menstrual period 11/10/2020, SpO2 100 %, unknown if currently breastfeeding. ? ?Physical Exam:  ?General: alert, cooperative, and no distress ?Lochia: appropriate ?Uterine Fundus: firm and below umbilicus  ?Incision: healing well, no significant drainage, dressing is clean, dry, and intact  ?DVT Evaluation: no LE edema or calf tenderness to palpation  ? ?Recent Labs  ?  08/10/21 ?0840  ?HGB 10.9*  ?HCT 33.9*  ? ? ?Assessment/Plan: ?Priscilla Phillips is a 26 y.o. G2P1011 on POD# 1 s/p pLTCS. ? ?Progressing well. Meeting postpartum milestones. VSS. Continue routine postpartum care. ? ?Feeding: Formula  ?Contraception: OCPs ?Circumcision: Desires inpatient, consented ? ?Dispo: Plan for discharge on POD#3.  ? ? LOS: 4 days  ? ?Genia Del, MD  ?08/11/2021, 6:41 AM  ? ? ?

## 2021-08-12 ENCOUNTER — Other Ambulatory Visit (HOSPITAL_COMMUNITY): Payer: Self-pay

## 2021-08-12 MED ORDER — IBUPROFEN 600 MG PO TABS
600.0000 mg | ORAL_TABLET | Freq: Four times a day (QID) | ORAL | 0 refills | Status: DC | PRN
  Filled 2021-08-12: qty 40, 10d supply, fill #0

## 2021-08-12 MED ORDER — OXYCODONE HCL 5 MG PO TABS
5.0000 mg | ORAL_TABLET | Freq: Four times a day (QID) | ORAL | 0 refills | Status: DC | PRN
  Filled 2021-08-12: qty 20, 5d supply, fill #0

## 2021-08-12 MED ORDER — ACETAMINOPHEN 500 MG PO TABS
1000.0000 mg | ORAL_TABLET | Freq: Three times a day (TID) | ORAL | 0 refills | Status: DC | PRN
  Filled 2021-08-12: qty 60, 10d supply, fill #0

## 2021-08-12 NOTE — Progress Notes (Signed)
Dr. Joneen Caraway called via vocera to check on babies TSB results. Dr. Joneen Caraway instructed nurse to stop bili lights and put in orders for blood serum draw for 1500 including blood typing. Dr. Joneen Caraway verbalized orders nurse repeated for confirmation. Orders were placed. Dr Joneen Caraway instructed Nurse to call with lab results. [name] was informed by nurse of the newest orders from Dr Joneen Caraway. Pt verbalized understanding.          Note Details  Author Katherina Right, LPN File Time [id number]  2:34 PM  Author Type Licensed Practical Nurse Status Signed  Last Editor Katherina Right, LPN Service (none)  Egan # [id number] Admit Date 08/09/2021

## 2021-08-16 ENCOUNTER — Encounter: Payer: Medicaid Other | Admitting: Advanced Practice Midwife

## 2021-08-16 ENCOUNTER — Ambulatory Visit (INDEPENDENT_AMBULATORY_CARE_PROVIDER_SITE_OTHER): Payer: Medicaid Other

## 2021-08-16 VITALS — BP 133/88 | HR 94 | Wt 141.0 lb

## 2021-08-16 DIAGNOSIS — Z5189 Encounter for other specified aftercare: Secondary | ICD-10-CM

## 2021-08-16 NOTE — Progress Notes (Signed)
Incision Check Visit  Priscilla Phillips is here for blood pressure check following primary c-section on 08/09/21. Steri strips removed. Incision appears clean, dry, and intact with well approximated edges. Reviewed wound care and s/s of infection. Patient to follow up at [facility] visit on 09/10/21.  Marjo Bicker, RN 08/16/2021

## 2021-08-18 DIAGNOSIS — Z3483 Encounter for supervision of other normal pregnancy, third trimester: Secondary | ICD-10-CM | POA: Diagnosis not present

## 2021-08-18 DIAGNOSIS — Z3482 Encounter for supervision of other normal pregnancy, second trimester: Secondary | ICD-10-CM | POA: Diagnosis not present

## 2021-08-24 ENCOUNTER — Other Ambulatory Visit (HOSPITAL_COMMUNITY): Payer: Self-pay

## 2021-08-25 ENCOUNTER — Other Ambulatory Visit: Payer: Medicaid Other

## 2021-08-25 ENCOUNTER — Encounter: Payer: Medicaid Other | Admitting: Certified Nurse Midwife

## 2021-09-10 ENCOUNTER — Ambulatory Visit: Payer: Medicaid Other | Admitting: Medical

## 2021-09-14 ENCOUNTER — Encounter: Payer: Self-pay | Admitting: Family Medicine

## 2021-10-05 NOTE — Progress Notes (Deleted)
    Post Partum Visit Note  Priscilla Phillips is a 25 y.o. G32P1011 female who presents for a postpartum visit. She is 8 weeks postpartum following a primary cesarean section.  I have fully reviewed the prenatal and intrapartum course. The delivery was at 38.6 gestational weeks.  Anesthesia: spinal. Postpartum course has been ***. Baby is doing well***. Baby is feeding by breast. Bleeding {vag bleed:12292}. Bowel function is {normal:32111}. Bladder function is {normal:32111}. Patient {is/is not:9024} sexually active. Contraception method is OCP (estrogen/progesterone). Postpartum depression screening: {gen negative/positive:315881}.   The pregnancy intention screening data noted above was reviewed. Potential methods of contraception were discussed. The patient elected to proceed with No data recorded.    Health Maintenance Due  Topic Date Due   COVID-19 Vaccine (1) Never done   TETANUS/TDAP  Never done    {Common ambulatory SmartLinks:19316}  Review of Systems {ros; complete:30496}  Objective:  LMP 11/10/2020 (Exact Date)    General:  {gen appearance:16600}   Breasts:  {desc; normal/abnormal/not indicated:14647}  Lungs: {lung exam:16931}  Heart:  {heart exam:5510}  Abdomen: {abdomen exam:16834}   Wound {Wound assessment:11097}  GU exam:  {desc; normal/abnormal/not indicated:14647}       Assessment:    There are no diagnoses linked to this encounter.  *** postpartum exam.   Plan:   Essential components of care per ACOG recommendations:  1.  Mood and well being: Patient with {gen negative/positive:315881} depression screening today. Reviewed local resources for support.  - Patient tobacco use? {tobacco use:25506}  - hx of drug use? {yes/no:25505}    2. Infant care and feeding:  -Patient currently breastmilk feeding? {yes/no:25502}  -Social determinants of health (SDOH) reviewed in EPIC. No concerns***The following needs were identified***  3. Sexuality, contraception  and birth spacing - Patient {DOES_DOES ION:62952} want a pregnancy in the next year.  Desired family size is {NUMBER 1-10:22536} children.  - Reviewed reproductive life planning. Reviewed contraceptive methods based on pt preferences and effectiveness.  Patient desired {Upstream End Methods:24109} today.   - Discussed birth spacing of 18 months  4. Sleep and fatigue -Encouraged family/partner/community support of 4 hrs of uninterrupted sleep to help with mood and fatigue  5. Physical Recovery  - Discussed patients delivery and complications. She describes her labor as {description:25511} - Patient had a {CHL AMB DELIVERY:[phone]}. Patient had a {laceration:25518} laceration. Perineal healing reviewed. Patient expressed understanding - Patient has urinary incontinence? {yes/no:25515} - Patient {ACTION; IS/IS WUX:32440102} safe to resume physical and sexual activity  6.  Health Maintenance - HM due items addressed {Yes or If no, why not?:20788} - Last pap smear No results found for: "DIAGPAP" Pap smear {done:10129} at today's visit.  -Breast Cancer screening indicated? {indicated:25516}  7. Chronic Disease/Pregnancy Condition follow up: {Follow up:25499}  - PCP follow up  Isabell Jarvis, RN [facility] [employer], [facility]

## 2021-10-06 ENCOUNTER — Ambulatory Visit: Payer: Medicaid Other | Admitting: Family Medicine

## 2021-10-26 ENCOUNTER — Encounter: Payer: Self-pay | Admitting: Family Medicine

## 2021-10-26 ENCOUNTER — Ambulatory Visit (INDEPENDENT_AMBULATORY_CARE_PROVIDER_SITE_OTHER): Payer: Medicaid Other | Admitting: Advanced Practice Midwife

## 2021-10-26 VITALS — BP 110/69 | HR 69 | Wt 133.6 lb

## 2021-10-26 DIAGNOSIS — Z30011 Encounter for initial prescription of contraceptive pills: Secondary | ICD-10-CM

## 2021-10-26 DIAGNOSIS — Z3202 Encounter for pregnancy test, result negative: Secondary | ICD-10-CM | POA: Diagnosis not present

## 2021-10-26 DIAGNOSIS — Z98891 History of uterine scar from previous surgery: Secondary | ICD-10-CM | POA: Diagnosis not present

## 2021-10-26 LAB — POCT PREGNANCY, URINE: Preg Test, Ur: NEGATIVE

## 2021-10-26 MED ORDER — NORETHIN ACE-ETH ESTRAD-FE 1-20 MG-MCG(24) PO TABS: 1.0000 | ORAL_TABLET | Freq: Every day | ORAL | 12 refills | Status: DC

## 2021-10-26 NOTE — Progress Notes (Signed)
Established Patient Office Visit  Subjective   Patient ID: Priscilla Phillips, female    DOB: 11-23-1995  Age: 26 y.o. MRN: [id number]  Chief Complaint  Patient presents with   Postpartum Care    Pt is 11 weeks PP C/S for fetal indications. Doing well. Bottle feeding. No problems with healing. Lochia had stopped. Patient's last menstrual period was 10/04/2020 (approximate). Resumed intercourse. Using condoms. Wants to start back on OCPs.   Last Pap: 2021 Nml.     Past Medical History:  Diagnosis Date   Anxiety    Ovarian cyst    Trichomonas infection    UTI (urinary tract infection)    Family History  Problem Relation Age of Onset   Cancer Mother        skin   Healthy Father    Diabetes Maternal Grandmother    Hypertension Maternal Grandmother    Past Surgical History:  Procedure Laterality Date   CESAREAN SECTION N/A 08/09/2021   Procedure: CESAREAN SECTION;  Surgeon: Hermina Staggers, MD;  Location: [facility] LD [facility]S;  Service: Obstetrics;  Laterality: N/A;   NO PAST SURGERIES      Review of Systems  Constitutional:  Negative for chills and fever.  Gastrointestinal:  Negative for abdominal pain.  Genitourinary:  Negative for dysuria and hematuria.       No vaginal discharge or dyspareunia.   Psychiatric/Behavioral:  Negative for depression.       Objective:     BP 110/69   Pulse 69   Wt 133 lb 9.6 oz (60.6 kg)   LMP 10/04/2020 (Approximate)   Breastfeeding No   BMI 26.98 kg/m    Physical Exam Vitals and nursing note reviewed.  Constitutional:      Appearance: Normal appearance. She is normal weight.  HENT:     Head: Normocephalic.     Mouth/Throat:     Mouth: Mucous membranes are moist.  Eyes:     Conjunctiva/sclera: Conjunctivae normal.  Cardiovascular:     Rate and Rhythm: Normal rate and regular rhythm.  Pulmonary:     Effort: Pulmonary effort is normal. No respiratory distress.  Abdominal:     General: Abdomen is flat. There is no distension.      Palpations: Abdomen is soft.     Tenderness: There is no abdominal tenderness.  Musculoskeletal:        General: No swelling.  Skin:    General: Skin is warm and dry.  Neurological:     General: No focal deficit present.     Mental Status: She is alert and oriented to person, place, and time.  Psychiatric:        Mood and Affect: Mood normal.      Results for orders placed or performed in visit on 10/26/21  Pregnancy, urine POC  Result Value Ref Range   Preg Test, Ur NEGATIVE NEGATIVE        Assessment & Plan:   Problem List Items Addressed This Visit       Other   S/P cesarean section   Other Visit Diagnoses     Oral contraception initiation    -  Primary   Relevant Medications   Norethindrone Acetate-Ethinyl Estrad-FE (LOESTRIN 24 FE) 1-20 MG-MCG(24) tablet   Negative pregnancy test       Relevant Orders   Pregnancy, urine POC (Completed)   Postpartum care and examination           Return in about 1 year (around 10/27/2022)  for Annual Gynecology appointment. W/ Pap.   Dorathy Kinsman, CNM

## 2021-12-01 ENCOUNTER — Ambulatory Visit
Admission: EM | Admit: 2021-12-01 | Discharge: 2021-12-01 | Disposition: A | Discharge status: Home / Self Care | Payer: Medicaid Other | Attending: Physician Assistant | Admitting: Physician Assistant

## 2021-12-01 DIAGNOSIS — J069 Acute upper respiratory infection, unspecified: Secondary | ICD-10-CM | POA: Insufficient documentation

## 2021-12-01 DIAGNOSIS — Z1152 Encounter for screening for COVID-19: Secondary | ICD-10-CM | POA: Diagnosis not present

## 2021-12-01 DIAGNOSIS — N898 Other specified noninflammatory disorders of vagina: Secondary | ICD-10-CM | POA: Diagnosis not present

## 2021-12-01 LAB — RESP PANEL BY RT-PCR (FLU A&B, COVID) ARPGX2
Influenza A by PCR: NEGATIVE
Influenza B by PCR: NEGATIVE
SARS Coronavirus 2 by RT PCR: NEGATIVE

## 2021-12-01 LAB — POCT URINE PREGNANCY: Preg Test, Ur: NEGATIVE

## 2021-12-01 LAB — POCT RAPID STREP A (OFFICE): Rapid Strep A Screen: NEGATIVE

## 2021-12-01 NOTE — ED Provider Notes (Signed)
[facility]    CSN: [id number] Arrival date & time: 12/01/21  0913      History   Chief Complaint Chief Complaint  Patient presents with   Vaginal Discharge    HPI Priscilla Phillips is a 26 y.o. female.   Patient here today for evaluation of sore throat, body aches, and fatigue that started 3 days ago.  She is not sure if she has had fever.  She has not had any cough and denies any nausea, vomiting or diarrhea.  She reports sore throat has improved somewhat.  She is taking Mucinex with minimal relief.  He also reports vaginal discharge that is milky.  She denies any known STD exposures but would like STD screening.  She does not report dysuria or abdominal pain.  The history is provided by the patient.  Vaginal Discharge Associated symptoms: no abdominal pain, no fever, no nausea and no vomiting     Past Medical History:  Diagnosis Date   Anxiety    Ovarian cyst    Trichomonas infection    UTI (urinary tract infection)     Patient Active Problem List   Diagnosis Date Noted   S/P cesarean section 08/09/2021   History of anxiety 02/12/2021    Past Surgical History:  Procedure Laterality Date   CESAREAN SECTION N/A 08/09/2021   Procedure: CESAREAN SECTION;  Surgeon: Hermina Staggers, MD;  Location: [facility] LD ORS;  Service: Obstetrics;  Laterality: N/A;   NO PAST SURGERIES      OB History     Gravida  2   Para  1   Term  1   Preterm      AB  1   Living  1      SAB  1   IAB      Ectopic      Multiple  0   Live Births  1            Home Medications    Prior to Admission medications   Medication Sig Start Date End Date Taking? Authorizing Provider  acetaminophen (TYLENOL) 500 MG tablet Take 2 tablets (1,000 mg total) by mouth every 8 (eight) hours as needed (pain). Patient not taking: Reported on 10/26/2021 08/12/21   Worthy Rancher, MD  ibuprofen (ADVIL) 600 MG tablet Take 1 tablet (600 mg total) by mouth every 6 (six) hours as  needed (pain). Patient not taking: Reported on 10/26/2021 08/12/21   Worthy Rancher, MD  Norethindrone Acetate-Ethinyl Estrad-FE (LOESTRIN 24 FE) 1-20 MG-MCG(24) tablet Take 1 tablet by mouth daily. 10/26/21   Katrinka Blazing, [place], CNM  oxyCODONE (OXY IR/ROXICODONE) 5 MG immediate release tablet Take 1 tablet (5 mg total) by mouth every 6 (six) hours as needed for breakthrough pain or severe pain. Patient not taking: Reported on 10/26/2021 08/12/21   Worthy Rancher, MD  Prenatal Vit-Fe Fumarate-FA (PRENATAL VITAMIN) 27-0.8 MG TABS Take 1 tablet by mouth daily. 12/10/20   Venora Maples, MD    Family History Family History  Problem Relation Age of Onset   Cancer Mother        skin   Healthy Father    Diabetes Maternal Grandmother    Hypertension Maternal Grandmother     Social History Social History   Tobacco Use   Smoking status: Never   Smokeless tobacco: Never  Vaping Use   Vaping Use: Former  Substance Use Topics   Alcohol use: Not Currently    Alcohol/week: 8.0  standard drinks of alcohol    Types: 7 Glasses of wine, 1 Standard drinks or equivalent per week   Drug use: Not Currently    Types: Marijuana    Comment: July 2022     Allergies   Patient has no known allergies.   Review of Systems Review of Systems  Constitutional:  Negative for chills and fever.  HENT:  Positive for sore throat. Negative for congestion and ear pain.   Eyes:  Negative for discharge and redness.  Respiratory:  Negative for cough and shortness of breath.   Gastrointestinal:  Negative for abdominal pain, nausea and vomiting.  Genitourinary:  Positive for vaginal discharge.     Physical Exam Triage Vital Signs ED Triage Vitals [12/01/21 0954]  Enc Vitals Group     BP 135/74     Pulse Rate 72     Resp 16     Temp 98.3 F (36.8 C)     Temp Source Oral     SpO2 98 %     Weight      Height      Head Circumference      Peak Flow      Pain Score 6     Pain Loc      Pain Edu?       Excl. in [place]?    No data found.  Updated Vital Signs BP 135/74 (BP Location: Left Arm)   Pulse 72   Temp 98.3 F (36.8 C) (Oral)   Resp 16   SpO2 98%   Breastfeeding No      Physical Exam Vitals and nursing note reviewed.  Constitutional:      General: She is not in acute distress.    Appearance: Normal appearance. She is not ill-appearing.  HENT:     Head: Normocephalic and atraumatic.  Eyes:     Conjunctiva/sclera: Conjunctivae normal.  Cardiovascular:     Rate and Rhythm: Normal rate.  Pulmonary:     Effort: Pulmonary effort is normal.  Neurological:     Mental Status: She is alert.  Psychiatric:        Mood and Affect: Mood normal.        Behavior: Behavior normal.        Thought Content: Thought content normal.      UC Treatments / Results  Labs (all labs ordered are listed, but only abnormal results are displayed) Labs Reviewed  RESP PANEL BY RT-PCR (FLU A&B, COVID) ARPGX2  POCT URINE PREGNANCY  POCT RAPID STREP A (OFFICE)  CERVICOVAGINAL ANCILLARY ONLY    EKG   Radiology No results found.  Procedures Procedures (including critical care time)  Medications Ordered in UC Medications - No data to display  Initial Impression / Assessment and Plan / UC Course  I have reviewed the triage vital signs and the nursing notes.  Pertinent labs & imaging results that were available during my care of the patient were reviewed by me and considered in my medical decision making (see chart for details).    Suspect viral etiology of upper respiratory symptoms.  Will order COVID and flu screening.  STD screening, BV and yeast screening also ordered for vaginal discharge.  Will await results for further recommendations been encouraged symptomatic treatment for symptoms in the meantime.  Recommend follow-up with any further concerns.  Final Clinical Impressions(s) / UC Diagnoses   Final diagnoses:  Viral upper respiratory tract infection  Encounter for  screening for COVID-19  Vaginal discharge  Discharge Instructions   None    ED Prescriptions   None    PDMP not reviewed this encounter.   Tomi Bamberger, PA-C 12/01/21 1106

## 2021-12-01 NOTE — ED Triage Notes (Signed)
Pt c/o sore throat, fatigue onset ~ 3 days ago  Also c/o vaginal discharge and odor, onset ~ 2 days ago

## 2021-12-02 ENCOUNTER — Telehealth (HOSPITAL_COMMUNITY): Payer: Self-pay | Admitting: Emergency Medicine

## 2021-12-02 LAB — CERVICOVAGINAL ANCILLARY ONLY
Bacterial Vaginitis (gardnerella): POSITIVE — AB
Candida Glabrata: NEGATIVE
Candida Vaginitis: POSITIVE — AB
Chlamydia: NEGATIVE
Comment: NEGATIVE
Comment: NEGATIVE
Comment: NEGATIVE
Comment: NEGATIVE
Comment: NEGATIVE
Comment: NORMAL
Neisseria Gonorrhea: NEGATIVE
Trichomonas: NEGATIVE

## 2021-12-02 MED ORDER — METRONIDAZOLE 500 MG PO TABS: 500.0000 mg | ORAL_TABLET | Freq: Two times a day (BID) | ORAL | 0 refills | Status: DC

## 2021-12-02 MED ORDER — FLUCONAZOLE 150 MG PO TABS: 150.0000 mg | ORAL_TABLET | Freq: Once | ORAL | 0 refills | Status: AC

## 2021-12-12 ENCOUNTER — Ambulatory Visit
Admission: EM | Admit: 2021-12-12 | Discharge: 2021-12-12 | Disposition: A | Discharge status: Home / Self Care | Payer: Medicaid Other | Attending: Family Medicine | Admitting: Family Medicine

## 2021-12-12 ENCOUNTER — Encounter: Payer: Self-pay | Admitting: Emergency Medicine

## 2021-12-12 ENCOUNTER — Other Ambulatory Visit: Payer: Self-pay

## 2021-12-12 DIAGNOSIS — Z20822 Contact with and (suspected) exposure to covid-19: Secondary | ICD-10-CM | POA: Diagnosis not present

## 2021-12-12 LAB — RESP PANEL BY RT-PCR (FLU A&B, COVID) ARPGX2
Influenza A by PCR: NEGATIVE
Influenza B by PCR: NEGATIVE
SARS Coronavirus 2 by RT PCR: NEGATIVE

## 2021-12-12 NOTE — ED Triage Notes (Signed)
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

## 2021-12-12 NOTE — ED Provider Notes (Signed)
[facility]    CSN: [id number] Arrival date & time: 12/12/21  1112      History   Chief Complaint Chief Complaint  Patient presents with   Covid Exposure    HPI HEYLEE TANT is a 26 y.o. female.   Patient presenting today requesting COVID-19 testing as she was exposed through work to [employer].  She is currently asymptomatic.  No history of chronic pulmonary disease.    Past Medical History:  Diagnosis Date   Anxiety    Ovarian cyst    Trichomonas infection    UTI (urinary tract infection)     Patient Active Problem List   Diagnosis Date Noted   S/P cesarean section 08/09/2021   History of anxiety 02/12/2021    Past Surgical History:  Procedure Laterality Date   CESAREAN SECTION N/A 08/09/2021   Procedure: CESAREAN SECTION;  Surgeon: Chancy Milroy, MD;  Location: [facility];  Service: Obstetrics;  Laterality: N/A;   NO PAST SURGERIES      OB History     Gravida  2   Para  1   Term  1   Preterm      AB  1   Living  1      SAB  1   IAB      Ectopic      Multiple  0   Live Births  1            Home Medications    Prior to Admission medications   Medication Sig Start Date End Date Taking? Authorizing Provider  acetaminophen (TYLENOL) 500 MG tablet Take 2 tablets (1,000 mg total) by mouth every 8 (eight) hours as needed (pain). Patient not taking: Reported on 10/26/2021 08/12/21   Genia Del, MD  ibuprofen (ADVIL) 600 MG tablet Take 1 tablet (600 mg total) by mouth every 6 (six) hours as needed (pain). Patient not taking: Reported on 10/26/2021 08/12/21   Genia Del, MD  metroNIDAZOLE (FLAGYL) 500 MG tablet Take 1 tablet (500 mg total) by mouth 2 (two) times daily. Patient not taking: Reported on 12/12/2021 12/02/21   Chase Picket, MD  Norethindrone Acetate-Ethinyl Estrad-FE (LOESTRIN 24 FE) 1-20 MG-MCG(24) tablet Take 1 tablet by mouth daily. 10/26/21   Tamala Julian, [place], CNM  oxyCODONE (OXY IR/ROXICODONE) 5  MG immediate release tablet Take 1 tablet (5 mg total) by mouth every 6 (six) hours as needed for breakthrough pain or severe pain. Patient not taking: Reported on 10/26/2021 08/12/21   Genia Del, MD  Prenatal Vit-Fe Fumarate-FA (PRENATAL VITAMIN) 27-0.8 MG TABS Take 1 tablet by mouth daily. 12/10/20   Clarnce Flock, MD    Family History Family History  Problem Relation Age of Onset   Cancer Mother        skin   Healthy Father    Diabetes Maternal Grandmother    Hypertension Maternal Grandmother     Social History Social History   Tobacco Use   Smoking status: Never   Smokeless tobacco: Never  Vaping Use   Vaping Use: Former  Substance Use Topics   Alcohol use: Not Currently    Alcohol/week: 8.0 standard drinks of alcohol    Types: 7 Glasses of wine, 1 Standard drinks or equivalent per week   Drug use: Not Currently    Types: Marijuana    Comment: July 2022     Allergies   Patient has no known allergies.   Review of Systems Review  of Systems Per HPI  Physical Exam Triage Vital Signs ED Triage Vitals [12/12/21 1126]  Enc Vitals Group     BP 128/80     Pulse Rate 91     Resp 18     Temp 97.9 F (36.6 C)     Temp Source Temporal     SpO2 97 %     Weight      Height      Head Circumference      Peak Flow      Pain Score 0     Pain Loc      Pain Edu?      Excl. in GC?    No data found.  Updated Vital Signs BP 128/80 (BP Location: Left Arm)   Pulse 91   Temp 97.9 F (36.6 C) (Temporal)   Resp 18   SpO2 97%   Visual Acuity Right Eye Distance:   Left Eye Distance:   Bilateral Distance:    Right Eye Near:   Left Eye Near:    Bilateral Near:     Physical Exam Vitals and nursing note reviewed.  Constitutional:      Appearance: Normal appearance. She is not ill-appearing.  HENT:     Head: Atraumatic.     Right Ear: Tympanic membrane normal.     Left Ear: Tympanic membrane normal.     Nose: Nose normal.     Mouth/Throat:      Mouth: Mucous membranes are moist.     Pharynx: Oropharynx is clear.  Eyes:     Extraocular Movements: Extraocular movements intact.     Conjunctiva/sclera: Conjunctivae normal.  Cardiovascular:     Rate and Rhythm: Normal rate and regular rhythm.     Heart sounds: Normal heart sounds.  Pulmonary:     Effort: Pulmonary effort is normal.     Breath sounds: Normal breath sounds.  Musculoskeletal:        General: Normal range of motion.     Cervical back: Normal range of motion and neck supple.  Skin:    General: Skin is warm and dry.  Neurological:     Mental Status: She is alert and oriented to person, place, and time.  Psychiatric:        Mood and Affect: Mood normal.        Thought Content: Thought content normal.        Judgment: Judgment normal.      UC Treatments / Results  Labs (all labs ordered are listed, but only abnormal results are displayed) Labs Reviewed  RESP PANEL BY RT-PCR (FLU A&B, COVID) [id number]    EKG   Radiology No results found.  Procedures Procedures (including critical care time)  Medications Ordered in UC Medications - No data to display  Initial Impression / Assessment and Plan / UC Course  I have reviewed the triage vital signs and the nursing notes.  Pertinent labs & imaging results that were available during my care of the patient were reviewed by me and considered in my medical decision making (see chart for details).     Vitals and exam benign and reassuring, COVID testing pending.  Discussed close monitoring and follow-up for worsening symptoms.  Final Clinical Impressions(s) / UC Diagnoses   Final diagnoses:  Exposure to COVID-19 virus   Discharge Instructions   None    ED Prescriptions   None    PDMP not reviewed this encounter.   Particia Nearing, [place] 12/12/21 1146

## 2022-01-04 ENCOUNTER — Ambulatory Visit
Admission: EM | Admit: 2022-01-04 | Discharge: 2022-01-04 | Disposition: A | Discharge status: Home / Self Care | Payer: Medicaid Other | Attending: Physician Assistant | Admitting: Physician Assistant

## 2022-01-04 DIAGNOSIS — J029 Acute pharyngitis, unspecified: Secondary | ICD-10-CM | POA: Diagnosis not present

## 2022-01-04 LAB — POCT RAPID STREP A (OFFICE): Rapid Strep A Screen: NEGATIVE

## 2022-01-04 MED ORDER — PREDNISONE 20 MG PO TABS: 40.0000 mg | ORAL_TABLET | Freq: Every day | ORAL | 0 refills | Status: AC

## 2022-01-04 NOTE — ED Provider Notes (Signed)
[facility]    CSN: [id number] Arrival date & time: 01/04/22  1349      History   Chief Complaint Chief Complaint  Patient presents with   Sore Throat    HPI Priscilla Phillips is a 26 y.o. female.   Patient here today for evaluation of sore throat that started 2 days ago. She has not had cough. She denies fever. Sore throat started after she has used a vape for the first time. She reports pain is a 7/10 and pain is worse with swallowing.  The history is provided by the patient.  Sore Throat Pertinent negatives include no abdominal pain and no shortness of breath.    Past Medical History:  Diagnosis Date   Anxiety    Ovarian cyst    Trichomonas infection    UTI (urinary tract infection)     Patient Active Problem List   Diagnosis Date Noted   S/P cesarean section 08/09/2021   History of anxiety 02/12/2021    Past Surgical History:  Procedure Laterality Date   CESAREAN SECTION N/A 08/09/2021   Procedure: CESAREAN SECTION;  Surgeon: Hermina Staggers, MD;  Location: [facility];  Service: Obstetrics;  Laterality: N/A;   NO PAST SURGERIES      OB History     Gravida  2   Para  1   Term  1   Preterm      AB  1   Living  1      SAB  1   IAB      Ectopic      Multiple  0   Live Births  1            Home Medications    Prior to Admission medications   Medication Sig Start Date End Date Taking? Authorizing Provider  predniSONE (DELTASONE) 20 MG tablet Take 2 tablets (40 mg total) by mouth daily with breakfast for 5 days. 01/04/22 01/09/22 Yes Tomi Bamberger, PA-C  acetaminophen (TYLENOL) 500 MG tablet Take 2 tablets (1,000 mg total) by mouth every 8 (eight) hours as needed (pain). Patient not taking: Reported on 10/26/2021 08/12/21   Worthy Rancher, MD  ibuprofen (ADVIL) 600 MG tablet Take 1 tablet (600 mg total) by mouth every 6 (six) hours as needed (pain). Patient not taking: Reported on 10/26/2021 08/12/21   Worthy Rancher, MD  metroNIDAZOLE (FLAGYL) 500 MG tablet Take 1 tablet (500 mg total) by mouth 2 (two) times daily. Patient not taking: Reported on 12/12/2021 12/02/21   Merrilee Jansky, MD  Norethindrone Acetate-Ethinyl Estrad-FE (LOESTRIN 24 FE) 1-20 MG-MCG(24) tablet Take 1 tablet by mouth daily. 10/26/21   Katrinka Blazing, [place], CNM  oxyCODONE (OXY IR/ROXICODONE) 5 MG immediate release tablet Take 1 tablet (5 mg total) by mouth every 6 (six) hours as needed for breakthrough pain or severe pain. Patient not taking: Reported on 10/26/2021 08/12/21   Worthy Rancher, MD  Prenatal Vit-Fe Fumarate-FA (PRENATAL VITAMIN) 27-0.8 MG TABS Take 1 tablet by mouth daily. 12/10/20   Venora Maples, MD    Family History Family History  Problem Relation Age of Onset   Cancer Mother        skin   Healthy Father    Diabetes Maternal Grandmother    Hypertension Maternal Grandmother     Social History Social History   Tobacco Use   Smoking status: Never   Smokeless tobacco: Never  Vaping Use   Vaping Use: Former  Substance Use Topics   Alcohol use: Not Currently    Alcohol/week: 8.0 standard drinks of alcohol    Types: 7 Glasses of wine, 1 Standard drinks or equivalent per week   Drug use: Not Currently    Types: Marijuana    Comment: July 2022     Allergies   Patient has no known allergies.   Review of Systems Review of Systems  Constitutional:  Negative for chills and fever.  HENT:  Positive for sore throat. Negative for congestion.   Eyes:  Negative for discharge and redness.  Respiratory:  Negative for cough and shortness of breath.   Gastrointestinal:  Negative for abdominal pain, nausea and vomiting.     Physical Exam Triage Vital Signs ED Triage Vitals  Enc Vitals Group     BP 01/04/22 1406 111/78     Pulse Rate 01/04/22 1406 89     Resp 01/04/22 1406 18     Temp 01/04/22 1406 98.1 F (36.7 C)     Temp src --      SpO2 01/04/22 1406 98 %     Weight --      Height --       Head Circumference --      Peak Flow --      Pain Score 01/04/22 1405 7     Pain Loc --      Pain Edu? --      Excl. in GC? --    No data found.  Updated Vital Signs BP 111/78   Pulse 89   Temp 98.1 F (36.7 C)   Resp 18   LMP 11/30/2021   SpO2 98%   Breastfeeding No      Physical Exam Vitals and nursing note reviewed.  Constitutional:      General: She is not in acute distress.    Appearance: Normal appearance. She is not ill-appearing.  HENT:     Head: Normocephalic and atraumatic.     Mouth/Throat:     Mouth: Mucous membranes are moist.     Pharynx: Oropharynx is clear. Posterior oropharyngeal erythema present. No oropharyngeal exudate.  Eyes:     Conjunctiva/sclera: Conjunctivae normal.  Cardiovascular:     Rate and Rhythm: Normal rate.  Pulmonary:     Effort: Pulmonary effort is normal.  Neurological:     Mental Status: She is alert.  Psychiatric:        Mood and Affect: Mood normal.        Behavior: Behavior normal.        Thought Content: Thought content normal.      UC Treatments / Results  Labs (all labs ordered are listed, but only abnormal results are displayed) Labs Reviewed  POCT RAPID STREP A (OFFICE)    EKG   Radiology No results found.  Procedures Procedures (including critical care time)  Medications Ordered in UC Medications - No data to display  Initial Impression / Assessment and Plan / UC Course  I have reviewed the triage vital signs and the nursing notes.  Pertinent labs & imaging results that were available during my care of the patient were reviewed by me and considered in my medical decision making (see chart for details).    Rapid strep test negative. Suspect throat irritated from vaping- will trial steroid burst to hopefully improve symptoms.  Final Clinical Impressions(s) / UC Diagnoses   Final diagnoses:  Acute pharyngitis, unspecified etiology   Discharge Instructions   None    ED  Prescriptions      Medication Sig Dispense Auth. Provider   predniSONE (DELTASONE) 20 MG tablet Take 2 tablets (40 mg total) by mouth daily with breakfast for 5 days. 10 tablet Francene Finders, PA-C      PDMP not reviewed this encounter.   Francene Finders, PA-C 01/04/22 1525

## 2022-01-04 NOTE — ED Triage Notes (Signed)
Pt presents to uc with co of sore throat. Pt reports she smoked a vape for the time 2 days ago and that's when she noticed the soreness starting. Pt denies any cough. Pt reports pain is 7/10 and is a burning sensation painful to swallow.

## 2022-09-04 ENCOUNTER — Ambulatory Visit
Admission: RE | Admit: 2022-09-04 | Discharge: 2022-09-04 | Disposition: A | Discharge status: Home / Self Care | Payer: Medicaid Other | Source: Ambulatory Visit | Attending: Urgent Care | Admitting: Urgent Care

## 2022-09-04 VITALS — BP 128/81 | HR 89 | Temp 98.3°F | Resp 20

## 2022-09-04 DIAGNOSIS — M778 Other enthesopathies, not elsewhere classified: Secondary | ICD-10-CM | POA: Diagnosis not present

## 2022-09-04 DIAGNOSIS — G5751 Tarsal tunnel syndrome, right lower limb: Secondary | ICD-10-CM | POA: Diagnosis not present

## 2022-09-04 MED ORDER — PREDNISONE 20 MG PO TABS: ORAL_TABLET | ORAL | 0 refills | Status: DC

## 2022-09-04 NOTE — ED Provider Notes (Signed)
[facility]  Note:  This document was prepared using Conservation officer, historic buildings and may include unintentional dictation errors.  MRN: [id number] DOB: [birth year]  Subjective:   Priscilla Phillips is a 27 y.o. female presenting for 40-month history of persistent intermittent right foot pain over the lateral aspect as well as the dorsum of her foot.  No fall, trauma recently.  The only thing she can remember is that she thought she sat on her foot wrong or possibly sprained it and then her pain persisted.  She does do a lot of work on her feet as she works for [employer].  She also has a toddler at home that she is always looking after and is very active.  No redness, warmth, erythema.  No wounds.  No fever.  No current facility-administered medications for this encounter.  Current Outpatient Medications:    acetaminophen (TYLENOL) 500 MG tablet, Take 2 tablets (1,000 mg total) by mouth every 8 (eight) hours as needed (pain). (Patient not taking: Reported on 10/26/2021), Disp: 60 tablet, Rfl: 0   ibuprofen (ADVIL) 600 MG tablet, Take 1 tablet (600 mg total) by mouth every 6 (six) hours as needed (pain). (Patient not taking: Reported on 10/26/2021), Disp: 40 tablet, Rfl: 0   metroNIDAZOLE (FLAGYL) 500 MG tablet, Take 1 tablet (500 mg total) by mouth 2 (two) times daily. (Patient not taking: Reported on 12/12/2021), Disp: 14 tablet, Rfl: 0   Norethindrone Acetate-Ethinyl Estrad-FE (LOESTRIN 24 FE) 1-20 MG-MCG(24) tablet, Take 1 tablet by mouth daily., Disp: 28 tablet, Rfl: 12   oxyCODONE (OXY IR/ROXICODONE) 5 MG immediate release tablet, Take 1 tablet (5 mg total) by mouth every 6 (six) hours as needed for breakthrough pain or severe pain. (Patient not taking: Reported on 10/26/2021), Disp: 20 tablet, Rfl: 0   Prenatal Vit-Fe Fumarate-FA (PRENATAL VITAMIN) 27-0.8 MG TABS, Take 1 tablet by mouth daily., Disp: 30 tablet, Rfl: 11   No Known Allergies  Past Medical History:   Diagnosis Date   Anxiety    Ovarian cyst    Trichomonas infection    UTI (urinary tract infection)      Past Surgical History:  Procedure Laterality Date   CESAREAN SECTION N/A 08/09/2021   Procedure: CESAREAN SECTION;  Surgeon: Hermina Staggers, MD;  Location: [facility];  Service: Obstetrics;  Laterality: N/A;   NO PAST SURGERIES      Family History  Problem Relation Age of Onset   Cancer Mother        skin   Healthy Father    Diabetes Maternal Grandmother    Hypertension Maternal Grandmother     Social History   Tobacco Use   Smoking status: Never   Smokeless tobacco: Never  Vaping Use   Vaping Use: Former  Substance Use Topics   Alcohol use: Not Currently    Comment: occ   Drug use: Not Currently    ROS   Objective:   Vitals: LMP 08/09/2022   Physical Exam Constitutional:      General: She is not in acute distress.    Appearance: Normal appearance. She is well-developed. She is not ill-appearing, toxic-appearing or diaphoretic.  HENT:     Head: Normocephalic and atraumatic.     Nose: Nose normal.     Mouth/Throat:     Mouth: Mucous membranes are moist.  Eyes:     General: No scleral icterus.       Right eye: No discharge.  Left eye: No discharge.     Extraocular Movements: Extraocular movements intact.  Cardiovascular:     Rate and Rhythm: Normal rate.  Pulmonary:     Effort: Pulmonary effort is normal.  Musculoskeletal:       Feet:     Comments: No tenderness elicited on exam.  Skin:    General: Skin is warm and dry.  Neurological:     General: No focal deficit present.     Mental Status: She is alert and oriented to person, place, and time.  Psychiatric:        Mood and Affect: Mood normal.        Behavior: Behavior normal.     Assessment and Plan :   PDMP not reviewed this encounter.  1. Extensor tendonitis of foot   2. Tarsal tunnel syndrome of right side    Patient requested aggressive management as she has been using  NSAIDs consistently without relief.  Offered her an oral prednisone course.  Emphasized need to try and modify her physical activities.  I applied a 3 inch Ace wrap to the right foot and ankle.  Recommended follow-up with podiatry, referral placed.  Counseled patient on potential for adverse effects with medications prescribed/recommended today, ER and return-to-clinic precautions discussed, patient verbalized understanding.    Wallis Bamberg, [place] 09/04/22 1126

## 2022-09-04 NOTE — ED Triage Notes (Addendum)
Pt c/o intermittent pain to right foot x 2 months-denies injury-NAD-steady gait-denies need for STD testing/pt was requesting yearly GYN "check up"

## 2022-09-16 ENCOUNTER — Ambulatory Visit: Payer: Medicaid Other | Admitting: Podiatry

## 2022-10-03 ENCOUNTER — Other Ambulatory Visit (HOSPITAL_COMMUNITY)
Admission: RE | Admit: 2022-10-03 | Discharge: 2022-10-03 | Disposition: A | Discharge status: Home / Self Care | Payer: Medicaid Other | Source: Ambulatory Visit | Attending: Internal Medicine | Admitting: Internal Medicine

## 2022-10-03 ENCOUNTER — Encounter: Payer: Self-pay | Admitting: Internal Medicine

## 2022-10-03 ENCOUNTER — Ambulatory Visit: Payer: Medicaid Other | Admitting: Internal Medicine

## 2022-10-03 VITALS — BP 124/78 | HR 82 | Temp 97.9°F | Ht 59.0 in | Wt 134.8 lb

## 2022-10-03 DIAGNOSIS — Z3200 Encounter for pregnancy test, result unknown: Secondary | ICD-10-CM | POA: Diagnosis not present

## 2022-10-03 DIAGNOSIS — N898 Other specified noninflammatory disorders of vagina: Secondary | ICD-10-CM | POA: Diagnosis not present

## 2022-10-03 DIAGNOSIS — R8290 Unspecified abnormal findings in urine: Secondary | ICD-10-CM | POA: Insufficient documentation

## 2022-10-03 LAB — POC URINALSYSI DIPSTICK (AUTOMATED)
Bilirubin, UA: NEGATIVE
Glucose, UA: NEGATIVE
Nitrite, UA: NEGATIVE
Protein, UA: POSITIVE — AB
Spec Grav, UA: 1.02 (ref 1.010–1.025)
Urobilinogen, UA: 1 E.U./dL
pH, UA: 7 (ref 5.0–8.0)

## 2022-10-03 LAB — POCT URINE PREGNANCY: Preg Test, Ur: NEGATIVE

## 2022-10-03 NOTE — Progress Notes (Signed)
[facility] [address] [place] [address] Dept: [phone] Dept Fax: [phone]  New Patient Office Visit  Subjective:   Priscilla Phillips 09-22-1995 10/03/2022  Chief Complaint  Patient presents with   Establish Care    Urine checked     HPI: Priscilla Phillips presents today to establish care at [facility]. Introduced to Publishing rights manager role and practice setting.  All questions answered.   Concerns: See below   Discussed the use of AI scribe software for clinical note transcription with the patient, who gave verbal consent to proceed.  History of Present Illness   The patient, with a history of a C-section, presents with concerns of a possible urinary tract infection (UTI) and bacterial vaginosis (BV). She reports a fishy vaginal odor that started a couple of weeks ago, which has since resolved. She denies any vaginal discharge or abdominal pain. She also had a pregnancy scare a month or two ago, but her period arrived a week early. She is currently menstruating. She is sexually active with one female partner and does not use protection. She denies any rash, lesions, fever, or chills. She also denies any pain during urination, but notes a possible foul smell during urination.       The following portions of the patient's history were reviewed and updated as appropriate: past medical history, past surgical history, family history, social history, allergies, medications, and problem list.   Patient Active Problem List   Diagnosis Date Noted   S/P cesarean section 08/09/2021   History of anxiety 02/12/2021   Past Medical History:  Diagnosis Date   Anxiety    Ovarian cyst    Trichomonas infection    UTI (urinary tract infection)    Past Surgical History:  Procedure Laterality Date   CESAREAN SECTION N/A 08/09/2021   Procedure: CESAREAN SECTION;  Surgeon: Hermina Staggers, MD;   Location: [facility];  Service: Obstetrics;  Laterality: N/A;   NO PAST SURGERIES     Family History  Problem Relation Age of Onset   Cancer Mother        skin   Healthy Father    Diabetes Maternal Grandmother    Hypertension Maternal Grandmother    Outpatient Medications Prior to Visit  Medication Sig Dispense Refill   ibuprofen (ADVIL) 600 MG tablet Take 1 tablet (600 mg total) by mouth every 6 (six) hours as needed (pain). (Patient not taking: Reported on 10/26/2021) 40 tablet 0   acetaminophen (TYLENOL) 500 MG tablet Take 2 tablets (1,000 mg total) by mouth every 8 (eight) hours as needed (pain). (Patient not taking: Reported on 10/26/2021) 60 tablet 0   metroNIDAZOLE (FLAGYL) 500 MG tablet Take 1 tablet (500 mg total) by mouth 2 (two) times daily. (Patient not taking: Reported on 12/12/2021) 14 tablet 0   Norethindrone Acetate-Ethinyl Estrad-FE (LOESTRIN 24 FE) 1-20 MG-MCG(24) tablet Take 1 tablet by mouth daily. 28 tablet 12   oxyCODONE (OXY IR/ROXICODONE) 5 MG immediate release tablet Take 1 tablet (5 mg total) by mouth every 6 (six) hours as needed for breakthrough pain or severe pain. (Patient not taking: Reported on 10/26/2021) 20 tablet 0   predniSONE (DELTASONE) 20 MG tablet Take 2 tablets daily with breakfast. 10 tablet 0   Prenatal Vit-Fe Fumarate-FA (PRENATAL VITAMIN) 27-0.8 MG TABS Take 1 tablet by mouth daily. 30 tablet 11   No facility-administered medications prior to visit.   No Known Allergies  ROS:  A complete ROS was performed with pertinent positives/negatives noted in the HPI. The remainder of the ROS are negative.   Objective:   Today's Vitals   10/03/22 1015  BP: 124/78  Pulse: 82  Temp: 97.9 F (36.6 C)  TempSrc: Temporal  SpO2: 99%  Weight: 134 lb 12.8 oz (61.1 kg)  Height: 4\' 11"  (1.499 m)   GENERAL: Well-appearing, in NAD. Well nourished.  SKIN: Pink, warm and dry. No rash, lesion, ulceration, or ecchymoses.  NECK: Trachea midline. Full ROM w/o pain or  tenderness. No lymphadenopathy.  RESPIRATORY: Chest wall symmetrical. Respirations even and non-labored. Breath sounds clear to auscultation bilaterally.  CARDIAC: S1, S2 present, regular rate and rhythm. Peripheral pulses 2+ bilaterally.  EXTREMITIES: Without clubbing, cyanosis, or edema.  NEUROLOGIC: No motor or sensory deficits. Steady, even gait.  PSYCH/MENTAL STATUS: Alert, oriented x 3. Cooperative, appropriate mood and affect.   Health Maintenance Due  Topic Date Due   DTaP/Tdap/Td (1 - Tdap) Never done   PAP-Cervical Cytology Screening  04/15/2022   PAP SMEAR-Modifier  04/15/2022    Results for orders placed or performed in visit on 10/03/22  POCT urine pregnancy  Result Value Ref Range   Preg Test, Ur Negative Negative  POCT Urinalysis Dipstick (Automated)  Result Value Ref Range   Color, UA yellow    Clarity, UA clear    Glucose, UA Negative Negative   Bilirubin, UA neg    Ketones, UA trace    Spec Grav, UA 1.020 1.010 - 1.025   Blood, UA moderate    pH, UA 7.0 5.0 - 8.0   Protein, UA Positive (A) Negative   Urobilinogen, UA 1.0 0.2 or 1.0 E.U./dL   Nitrite, UA neg    Leukocytes, UA Moderate (2+) Negative       Assessment & Plan:  Assessment and Plan    Vaginal Discharge: Reported fishy smell a few weeks ago, but currently asymptomatic. No discharge or abdominal pain. -Perform self-swab for BV, yeast, gonorrhea, chlamydia, and trachomatis.  Possible UTI: No current symptoms, but reported foul smell when urinating. Urinalysis showed protein, moderate leuks, ketones, but no nitrites. -Culture urine to rule out UTI.  Pregnancy Concern: Recent pregnancy scare, but menstruation occurred a week early. Currently menstruating. -Perform urine pregnancy test.  General Health Maintenance: Last Pap smear unknown, but due according to insurance. -Schedule Pap smear and physical in 3 months, Update tetanus shot if needed at that time.       Return in about 3 months  (around 01/03/2023) for Annual Physical Exam.   Of note, portions of this note may have been created with voice recognition software Physicist, medical). While this note has been edited for accuracy, occasional wrong-word or 'sound-a-like' substitutions may have occurred due to the inherent limitations of voice recognition software.  Salvatore Decent, FNP

## 2022-10-04 ENCOUNTER — Other Ambulatory Visit: Payer: Self-pay | Admitting: Internal Medicine

## 2022-10-04 DIAGNOSIS — N760 Acute vaginitis: Secondary | ICD-10-CM

## 2022-10-04 LAB — CERVICOVAGINAL ANCILLARY ONLY
Bacterial Vaginitis (gardnerella): POSITIVE — AB
Candida Glabrata: NEGATIVE
Candida Vaginitis: NEGATIVE
Chlamydia: NEGATIVE
Comment: NEGATIVE
Comment: NEGATIVE
Comment: NEGATIVE
Comment: NEGATIVE
Comment: NEGATIVE
Comment: NORMAL
Neisseria Gonorrhea: NEGATIVE
Trichomonas: NEGATIVE

## 2022-10-04 LAB — URINE CULTURE
MICRO NUMBER:: 15170582
Result:: NO GROWTH
SPECIMEN QUALITY:: ADEQUATE

## 2022-10-04 MED ORDER — METRONIDAZOLE 500 MG PO TABS
500.00 mg | ORAL_TABLET | Freq: Two times a day (BID) | ORAL | 0 refills | Status: AC
Start: 2022-10-04 — End: 2022-10-11

## 2022-10-06 ENCOUNTER — Ambulatory Visit (INDEPENDENT_AMBULATORY_CARE_PROVIDER_SITE_OTHER): Payer: Medicaid Other | Admitting: Podiatry

## 2022-10-06 DIAGNOSIS — Z91199 Patient's noncompliance with other medical treatment and regimen due to unspecified reason: Secondary | ICD-10-CM

## 2022-10-06 NOTE — Progress Notes (Signed)
Pt was a no show for apt [place] 

## 2022-10-13 ENCOUNTER — Telehealth: Payer: Self-pay | Admitting: Podiatry

## 2022-10-13 NOTE — Telephone Encounter (Signed)
Pt scheduled an appt thru my chart but did it as an est pt and she is a new pt. I changed her time of the appt to 100pm arrival at 1245pm. I asked pt to call if that did not work for her.

## 2022-10-22 IMAGING — US US OB TRANSVAGINAL
1 series · 15 of 28 positions shown · non-contrast
Comparison: December 07, 2020.

CLINICAL DATA: Five bili deep. Estimated gestational age of 6
weeks, 2 days by LMP.

EXAM:
TRANSVAGINAL OB ULTRASOUND
TECHNIQUE: Transvaginal ultrasound was performed for complete evaluation of the
gestation as well as the maternal uterus, adnexal regions, and
pelvic cul-de-sac.

[Series 1: us ob transvaginal · 15 of 53 slices shown]
[im 1/53]
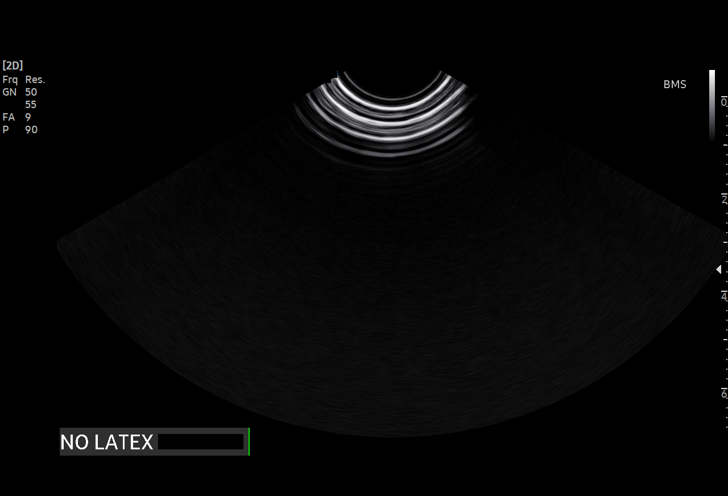
[im 4/53]
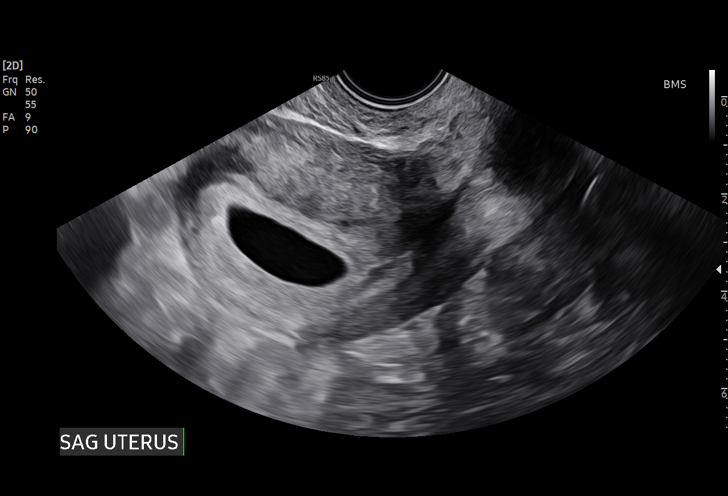
[im 8/53]
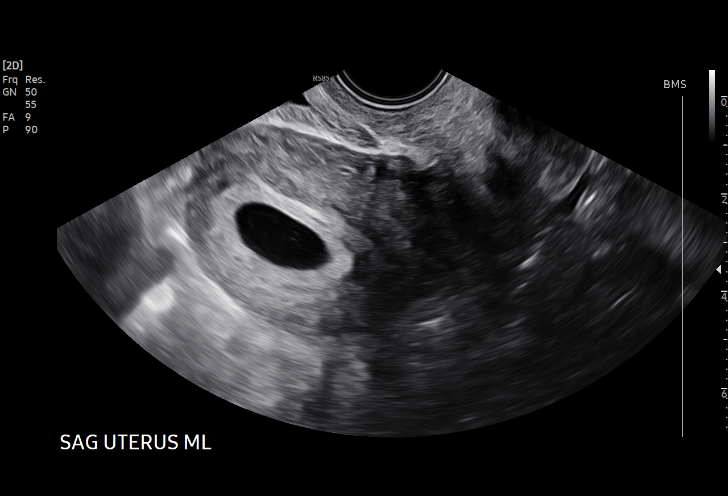
[im 12/53]
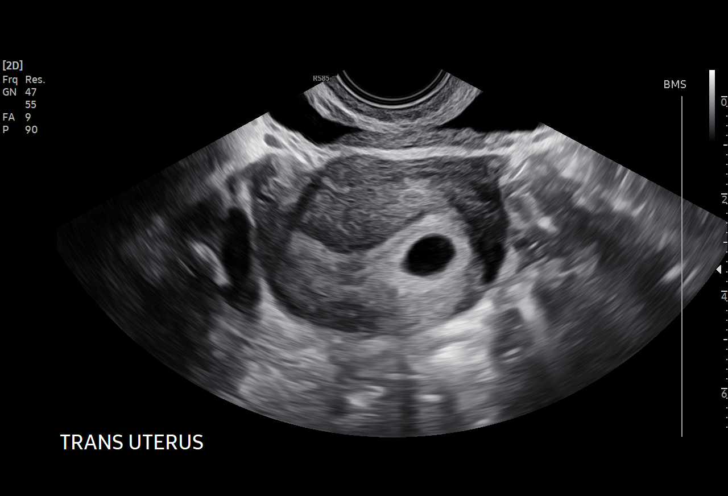
[im 16/53]
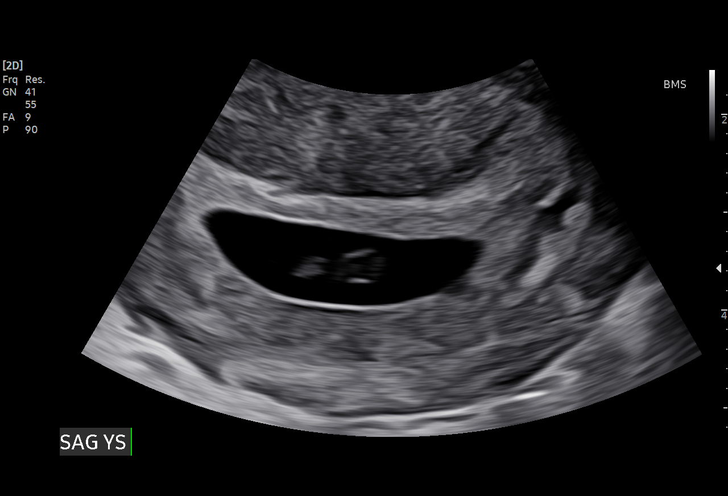
[im 20/53]
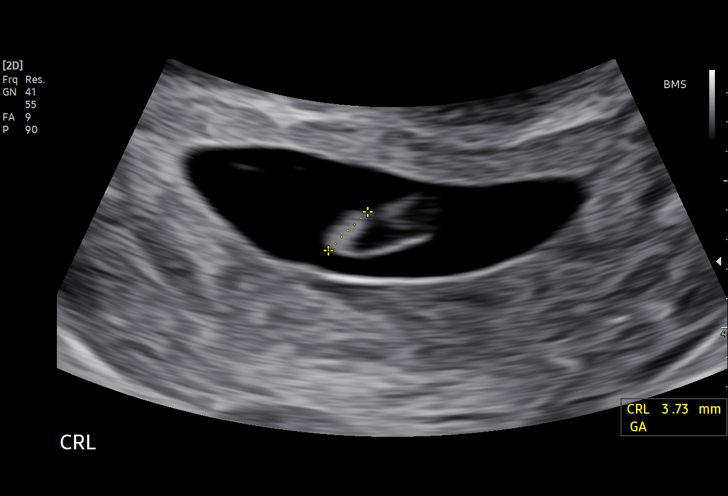
[im 24/53]
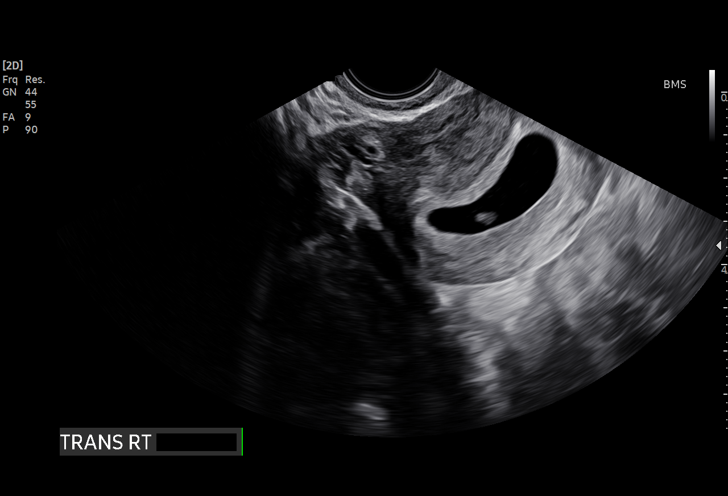
[im 27/53]
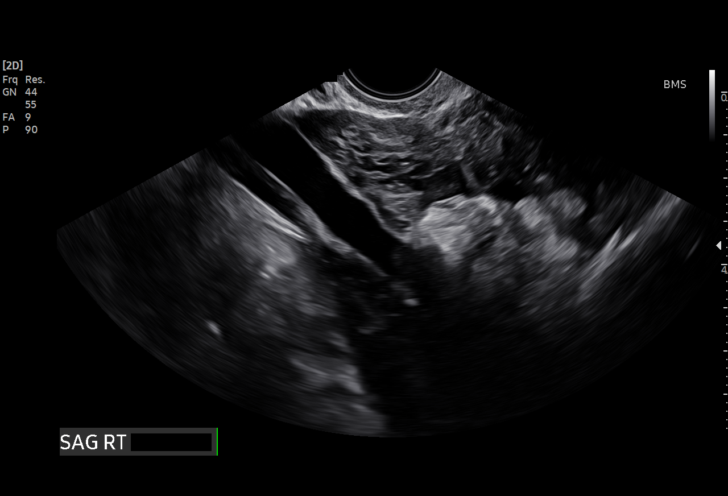
[im 29/53]
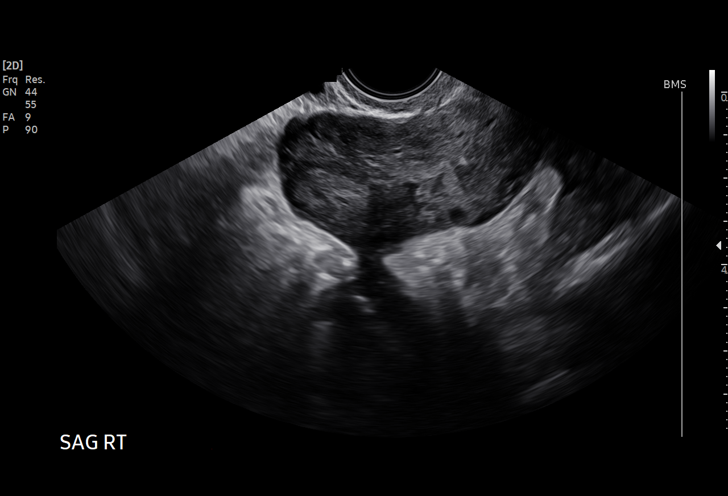
[im 33/53]
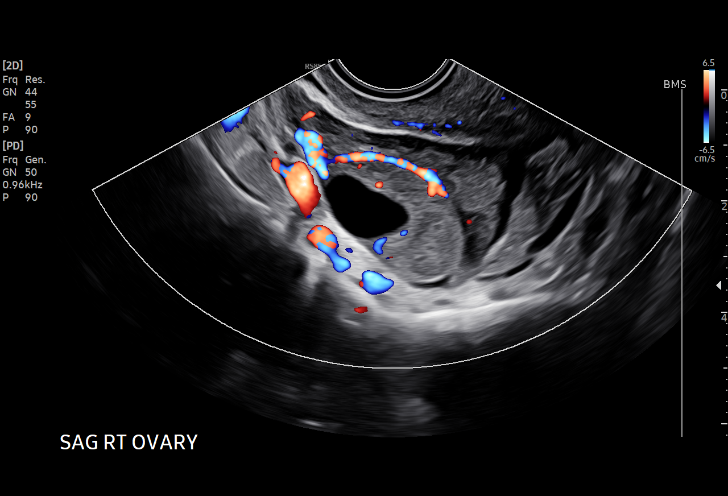
[im 37/53]
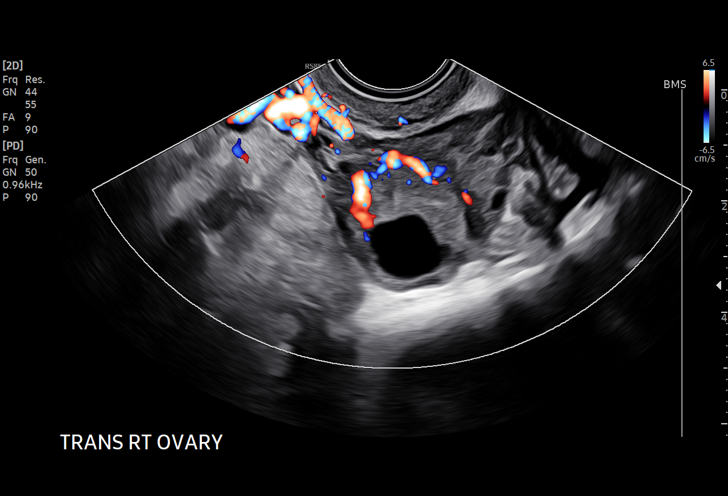
[im 41/53]
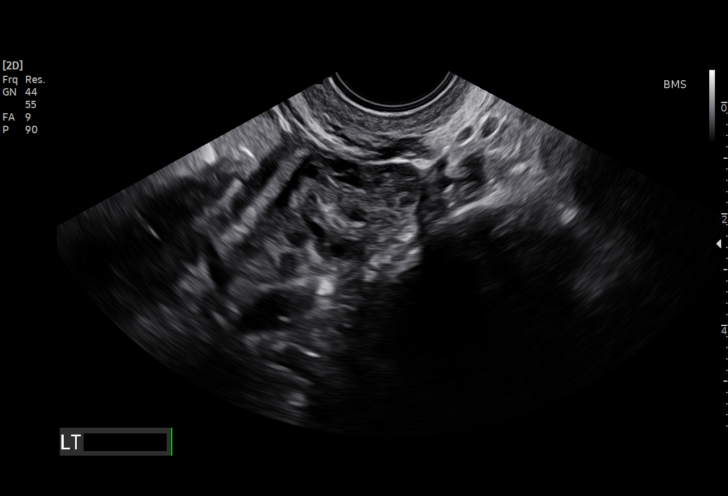
[im 45/53]
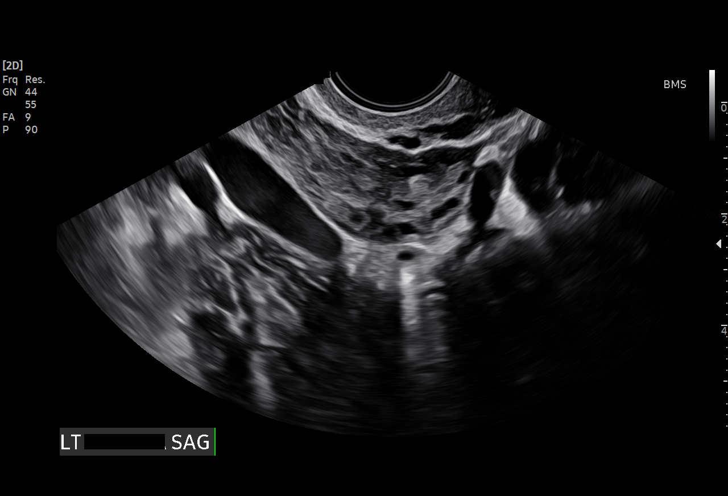
[im 49/53]
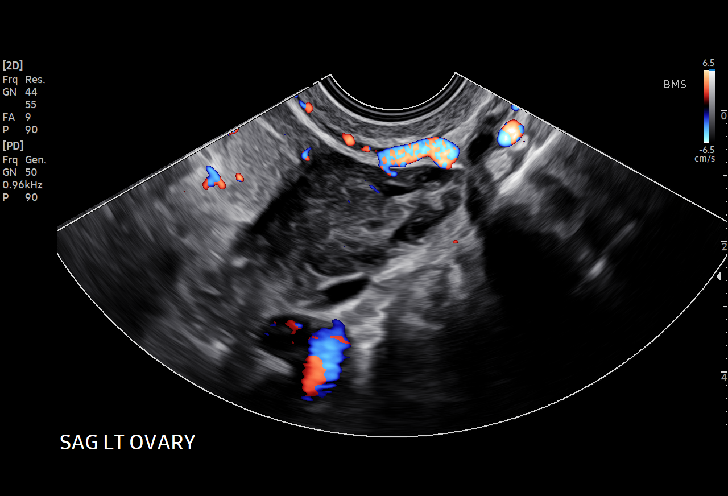
[im 53/53]
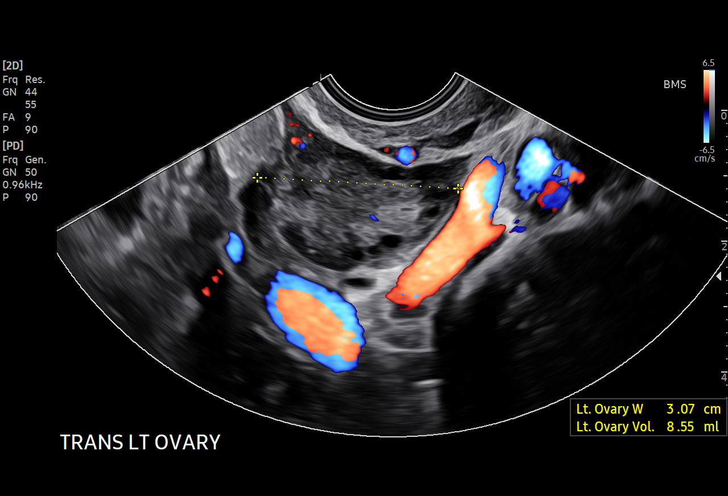

[15 of 28 positions shown; findings below may reference images not displayed]

FINDINGS: Intrauterine gestational sac: Single.

Yolk sac:  Visualized.

Embryo:  Visualized.

Cardiac Activity: Visualized.

Heart Rate: 123 bpm

CRL:   3.6 mm   6 w 0 d                  US EDC: 08/19/2021

Subchorionic hemorrhage:  None visualized.

Maternal uterus/adnexae: Unremarkable.  Right ovarian corpus luteum.
IMPRESSION: 1. Single live intrauterine pregnancy with estimated gestational age
of 6 weeks, 0 days.

## 2022-10-28 ENCOUNTER — Ambulatory Visit: Payer: Medicaid Other | Admitting: Podiatry

## 2022-10-28 DIAGNOSIS — Z91199 Patient's noncompliance with other medical treatment and regimen due to unspecified reason: Secondary | ICD-10-CM

## 2022-10-28 NOTE — Progress Notes (Signed)
No show for apt.

## 2022-11-24 ENCOUNTER — Encounter: Payer: Self-pay | Admitting: Internal Medicine

## 2022-11-24 NOTE — Progress Notes (Deleted)
[facility] [address] [place] [address] Dept: [phone] Dept Fax: [phone]  Acute Care Office Visit  Subjective:   Priscilla Phillips 11/16/1995 11/25/2022  No chief complaint on file.   HPI:  URINARY SYMPTOMS Onset: ***   Fever/chills: {Blank single:19197::"yes","no"} Dysuria: {Blank single:19197::"yes","no","burning"} Urinary frequency: {Blank single:19197::"yes","no"} Urgency: {Blank single:19197::"yes","no"} Foul odor: {Blank single:19197::"yes","no"} Urinary incontinence: {Blank single:19197::"yes","no"} Hematuria: {Blank single:19197::"yes","no"} Abdominal pain: {Blank single:19197::"yes","no"} Suprapubic pain/pressure: {Blank single:19197::"yes","no"} Flank/low back pain: {Blank single:19197::"yes","no"} Nausea/Vomiting: {Blank single:19197::"yes","no"}  Treatments tried: ***  Previous urinary tract infection: {Blank single:19197::"yes","no"} Recurrent urinary tract infection: {Blank single:19197::"yes","no"} History of sexually transmitted disease: {Blank single:19197::"yes","no"}    The following portions of the patient's history were reviewed and updated as appropriate: past medical history, past surgical history, family history, social history, allergies, medications, and problem list.   Patient Active Problem List   Diagnosis Date Noted   S/P cesarean section 08/09/2021   History of anxiety 02/12/2021   Past Medical History:  Diagnosis Date   Anxiety    Ovarian cyst    Trichomonas infection    UTI (urinary tract infection)    Past Surgical History:  Procedure Laterality Date   CESAREAN SECTION N/A 08/09/2021   Procedure: CESAREAN SECTION;  Surgeon: Hermina Staggers, MD;  [facility];  Service: Obstetrics;  Laterality: N/A;   NO PAST SURGERIES     Family History  Problem Relation Age of Onset   Cancer Mother        skin   Healthy Father    Diabetes Maternal  Grandmother    Hypertension Maternal Grandmother     Current Outpatient Medications:    ibuprofen (ADVIL) 600 MG tablet, Take 1 tablet (600 mg total) by mouth every 6 (six) hours as needed (pain). (Patient not taking: Reported on 10/26/2021), Disp: 40 tablet, Rfl: 0 No Known Allergies   ROS: A complete ROS was performed with pertinent positives/negatives noted in the HPI. The remainder of the ROS are negative.    Objective:   There were no vitals filed for this visit.  GENERAL: Well-appearing, in NAD. Well nourished.  SKIN: Pink, warm and dry. No rash, lesion, ulceration, or ecchymoses.  NECK: Trachea midline. Full ROM w/o pain or tenderness. No lymphadenopathy.  RESPIRATORY: Chest wall symmetrical. Respirations even and non-labored. Breath sounds clear to auscultation bilaterally.  CARDIAC: S1, S2 present, regular rate and rhythm. Peripheral pulses 2+ bilaterally.  MSK: Muscle tone and strength appropriate for age. Joints w/o tenderness, redness, or swelling. EXTREMITIES: Without clubbing, cyanosis, or edema.  NEUROLOGIC: No motor or sensory deficits. Steady, even gait.  PSYCH/MENTAL STATUS: Alert, oriented x 3. Cooperative, appropriate mood and affect.    No results found for any visits on 11/25/22.    Assessment & Plan:  *** No orders of the defined types were placed in this encounter.  No orders of the defined types were placed in this encounter.  Lab Orders  No laboratory test(s) ordered today   No images are attached to the encounter or orders placed in the encounter.  No follow-ups on file.   Salvatore Decent, FNP

## 2022-11-25 ENCOUNTER — Ambulatory Visit: Payer: Medicaid Other | Admitting: Internal Medicine

## 2022-11-26 ENCOUNTER — Ambulatory Visit
Admission: EM | Admit: 2022-11-26 | Discharge: 2022-11-26 | Disposition: A | Discharge status: Home / Self Care | Payer: Medicaid Other | Attending: Internal Medicine | Admitting: Internal Medicine

## 2022-11-26 DIAGNOSIS — R350 Frequency of micturition: Secondary | ICD-10-CM

## 2022-11-26 DIAGNOSIS — N10 Acute tubulo-interstitial nephritis: Secondary | ICD-10-CM | POA: Insufficient documentation

## 2022-11-26 LAB — POCT URINALYSIS DIP (MANUAL ENTRY)
Bilirubin, UA: NEGATIVE
Glucose, UA: NEGATIVE mg/dL
Ketones, POC UA: NEGATIVE mg/dL
Nitrite, UA: POSITIVE — AB
Protein Ur, POC: >=300 mg/dL — AB
Spec Grav, UA: >=1.03 — AB (ref 1.010–1.025)
Urobilinogen, UA: 0.2 E.U./dL
pH, UA: 6.5 (ref 5.0–8.0)

## 2022-11-26 LAB — POCT URINE PREGNANCY: Preg Test, Ur: NEGATIVE

## 2022-11-26 MED ORDER — CEFTRIAXONE SODIUM 1 G IJ SOLR
1.0000 g | Freq: Once | INTRAMUSCULAR | Status: AC
  Administered 2022-11-26: 1 g via INTRAMUSCULAR

## 2022-11-26 MED ORDER — CIPROFLOXACIN HCL 500 MG PO TABS: 500.0000 mg | ORAL_TABLET | Freq: Two times a day (BID) | ORAL | 0 refills | Status: DC

## 2022-11-26 NOTE — ED Provider Notes (Signed)
[facility] - URGENT CARE CENTER  Note:  This document was prepared using Conservation officer, historic buildings and may include unintentional dictation errors.  MRN: [id number] DOB: [birth year]  Subjective:   Priscilla Phillips is a 27 y.o. female presenting for 3-day history of acute onset persistent worsening abdominal pain, urinary frequency, urinary urgency, cloudy urine, pelvic pain.  Patient has taken Tylenol to help her with her pain.  Has not taken any other medications.  No vaginal discharge.  Would like STI testing just to make sure.  No current facility-administered medications for this encounter.  Current Outpatient Medications:    ibuprofen (ADVIL) 600 MG tablet, Take 1 tablet (600 mg total) by mouth every 6 (six) hours as needed (pain). (Patient not taking: Reported on 10/26/2021), Disp: 40 tablet, Rfl: 0   No Known Allergies  Past Medical History:  Diagnosis Date   Anxiety    Ovarian cyst    Trichomonas infection    UTI (urinary tract infection)      Past Surgical History:  Procedure Laterality Date   CESAREAN SECTION N/A 08/09/2021   Procedure: CESAREAN SECTION;  Surgeon: Hermina Staggers, MD;  Location: [facility]S;  Service: Obstetrics;  Laterality: N/A;   NO PAST SURGERIES      Family History  Problem Relation Age of Onset   Cancer Mother        skin   Healthy Father    Diabetes Maternal Grandmother    Hypertension Maternal Grandmother     Social History   Tobacco Use   Smoking status: Never   Smokeless tobacco: Never  Vaping Use   Vaping status: Former  Substance Use Topics   Alcohol use: Yes    Comment: occ   Drug use: Not Currently    ROS   Objective:   Vitals: BP 131/76 (BP Location: Right Arm)   Pulse 95   Temp 99.1 F (37.3 C) (Oral)   Resp 20   LMP  (LMP Unknown)   SpO2 100%   Physical Exam Constitutional:      General: She is not in acute distress.    Appearance: Normal appearance. She is well-developed. She is not  ill-appearing, toxic-appearing or diaphoretic.  HENT:     Head: Normocephalic and atraumatic.     Nose: Nose normal.     Mouth/Throat:     Mouth: Mucous membranes are moist.     Pharynx: Oropharynx is clear.  Eyes:     General: No scleral icterus.       Right eye: No discharge.        Left eye: No discharge.     Extraocular Movements: Extraocular movements intact.     Conjunctiva/sclera: Conjunctivae normal.  Cardiovascular:     Rate and Rhythm: Normal rate.  Pulmonary:     Effort: Pulmonary effort is normal.  Abdominal:     General: Bowel sounds are normal. There is no distension.     Palpations: Abdomen is soft. There is no mass.     Tenderness: There is no abdominal tenderness. There is left CVA tenderness. There is no right CVA tenderness, guarding or rebound.  Skin:    General: Skin is warm and dry.  Neurological:     General: No focal deficit present.     Mental Status: She is alert and oriented to person, place, and time.  Psychiatric:        Mood and Affect: Mood normal.        Behavior: Behavior normal.  Thought Content: Thought content normal.        Judgment: Judgment normal.     Results for orders placed or performed during the hospital encounter of 11/26/22 (from the past 24 hour(s))  POCT urinalysis dipstick     Status: Abnormal   Collection Time: 11/26/22  8:32 AM  Result Value Ref Range   Color, UA yellow yellow   Clarity, UA cloudy (A) clear   Glucose, UA negative negative mg/dL   Bilirubin, UA negative negative   Ketones, POC UA negative negative mg/dL   Spec Grav, UA >=5.409 (A) 1.010 - 1.025   Blood, UA large (A) negative   pH, UA 6.5 5.0 - 8.0   Protein Ur, POC >=300 (A) negative mg/dL   Urobilinogen, UA 0.2 0.2 or 1.0 E.U./dL   Nitrite, UA Positive (A) Negative   Leukocytes, UA Large (3+) (A) Negative  POCT urine pregnancy     Status: None   Collection Time: 11/26/22  8:32 AM  Result Value Ref Range   Preg Test, Ur Negative Negative    IM ceftriaxone at 1000 mg administered in clinic.  Assessment and Plan :   PDMP not reviewed this encounter.  1. Acute pyelonephritis   2. Urinary frequency    Will manage with acute pyelonephritis using IM ceftriaxone as above, ciprofloxacin as an outpatient.  Urine culture pending.  Emphasized need for consistent hydration, avoidance of urinary irritants.  STI testing pending.  Counseled patient on potential for adverse effects with medications prescribed/recommended today, ER and return-to-clinic precautions discussed, patient verbalized understanding.    Wallis Bamberg, [place] 11/26/22 [phone]

## 2022-11-26 NOTE — ED Triage Notes (Signed)
Pt c/o RLQ abd pain started yesterday-cloudy urine and freq x 2-3 days-taking tylenol with last dose ~730am-griamcing

## 2022-11-26 NOTE — Discharge Instructions (Addendum)
You have received an injection of ceftriaxone to jump start your treatment for pyelonephritis. Please start ciprofloxacin to address a kidney infection. Make sure you hydrate very well with plain water and a quantity of 80 ounces of water a day.  Please limit drinks that are considered urinary irritants such as soda, sweet tea, coffee, energy drinks, alcohol.  These can worsen your urinary and genital symptoms but also be the source of them.  I will let you know about your urine culture results through MyChart to see if we need to prescribe or change your antibiotics based off of those results.

## 2022-11-28 LAB — URINE CULTURE: Culture: >=100000 — AB

## 2022-11-30 ENCOUNTER — Telehealth: Payer: Self-pay

## 2022-11-30 MED ORDER — METRONIDAZOLE 500 MG PO TABS: 500.0000 mg | ORAL_TABLET | Freq: Two times a day (BID) | ORAL | 0 refills | Status: AC

## 2022-11-30 NOTE — Telephone Encounter (Signed)
 Per protocol, pt requires tx with metronidazole. Attempted to reach patient x1. Unable to LVM.  Rx sent to pharmacy on file.

## 2022-12-01 LAB — CERVICOVAGINAL ANCILLARY ONLY
Bacterial Vaginitis (gardnerella): POSITIVE — AB
Candida Glabrata: NEGATIVE
Candida Vaginitis: NEGATIVE
Chlamydia: NEGATIVE
Comment: NEGATIVE
Comment: NEGATIVE
Comment: NEGATIVE
Comment: NEGATIVE
Comment: NEGATIVE
Comment: NORMAL
Neisseria Gonorrhea: NEGATIVE
Trichomonas: NEGATIVE

## 2022-12-26 ENCOUNTER — Encounter: Payer: Self-pay | Admitting: Internal Medicine

## 2022-12-26 ENCOUNTER — Ambulatory Visit: Payer: Medicaid Other | Admitting: Internal Medicine

## 2022-12-26 VITALS — BP 114/70 | HR 70 | Temp 97.6°F | Ht 59.0 in | Wt 133.4 lb

## 2022-12-26 DIAGNOSIS — Z3201 Encounter for pregnancy test, result positive: Secondary | ICD-10-CM | POA: Diagnosis not present

## 2022-12-26 DIAGNOSIS — Z87440 Personal history of urinary (tract) infections: Secondary | ICD-10-CM

## 2022-12-26 LAB — POC URINALSYSI DIPSTICK (AUTOMATED)
Bilirubin, UA: NEGATIVE
Blood, UA: NEGATIVE
Glucose, UA: NEGATIVE
Ketones, UA: NEGATIVE
Leukocytes, UA: NEGATIVE
Nitrite, UA: NEGATIVE
Protein, UA: NEGATIVE
Spec Grav, UA: 1.015 (ref 1.010–1.025)
Urobilinogen, UA: 0.2 U/dL
pH, UA: 7 (ref 5.0–8.0)

## 2022-12-26 LAB — POCT PREGNANCY, URINE

## 2022-12-26 NOTE — Patient Instructions (Addendum)
Start taking prenatal vitamin once daily . Should contain at least of Folic Acid.   Drink plenty of water.

## 2022-12-26 NOTE — Progress Notes (Signed)
[facility]ARE [facility] [address] [place] [address] Dept: [phone] Dept Fax: [phone]  Acute Care Office Visit  Subjective:   Priscilla Phillips [birth year] 12/26/2022  No chief complaint on file.   HPI: Priscilla Phillips is a 27 yo F who reports a positive home pregnancy test on 12/23/22 LMP: August 14th  G3 P1 A1 Does report nausea. 1 episode of vomiting last night. No vaginal bleeding.  Is not taking prenatal vitamin currently.  Patient does not have OB/GYN. Does not use tobacco  or  illegal drugs. Does not currently consume ETOH.    Patient would also like to be checked for UTI. She was seen on at [facility] 11/26/22 for acute pyelonephritis, was tx'd with IM ceftriaxone and Rx for Cipro. Patient did take ABX as prescribed. Currently denies fever, chills, abdominal pain, flank pain, dysuria.   The following portions of the patient's history were reviewed and updated as appropriate: past medical history, past surgical history, family history, social history, allergies, medications, and problem list.   Patient Active Problem List   Diagnosis Date Noted   S/P cesarean section 08/09/2021   History of anxiety 02/12/2021   Past Medical History:  Diagnosis Date   Anxiety    Ovarian cyst    Trichomonas infection    UTI (urinary tract infection)    Past Surgical History:  Procedure Laterality Date   CESAREAN SECTION N/A 08/09/2021   Procedure: CESAREAN SECTION;  Surgeon: Hermina Staggers, MD;  Location: [facility] LD [facility];  Service: Obstetrics;  Laterality: N/A;   NO PAST SURGERIES     Family History  Problem Relation Age of Onset   Cancer Mother        skin   Healthy Father    Diabetes Maternal Grandmother    Hypertension Maternal Grandmother     Current Outpatient Medications:    ibuprofen (ADVIL) 600 MG tablet, Take 1 tablet (600 mg total) by mouth every 6 (six) hours as needed (pain). (Patient not taking: Reported on  10/26/2021), Disp: 40 tablet, Rfl: 0 No Known Allergies   ROS: A complete ROS was performed with pertinent positives/negatives noted in the HPI. The remainder of the ROS are negative.    Objective:   Today's Vitals   12/26/22 1413  BP: 114/70  Pulse: 70  Temp: 97.6 F (36.4 C)  TempSrc: Temporal  SpO2: 99%  Weight: 133 lb 6.4 oz (60.5 kg)  Height: 4\' 11"  (1.499 m)    GENERAL: Well-appearing, in NAD. Well nourished.  SKIN: Pink, warm and dry.  RESPIRATORY: Chest wall symmetrical. Respirations even and non-labored. Breath sounds clear to auscultation bilaterally.  CARDIAC: S1, S2 present, regular rate and rhythm. Peripheral pulses 2+ bilaterally.  EXTREMITIES: Without clubbing, cyanosis, or edema.  NEUROLOGIC:  Steady, even gait.  PSYCH/MENTAL STATUS: Alert, oriented x 3. Cooperative, appropriate mood and affect.    Results for orders placed or performed in visit on 12/26/22  POCT Pregnancy, Urine  Result Value Ref Range   Positive        Assessment & Plan:   Positive pregnancy test -     POCT Pregnancy, Urine -     Ambulatory referral to Obstetrics / Gynecology  History of UTI -     POCT Urinalysis Dipstick (Automated)   No orders of the defined types were placed in this encounter.  Orders Placed This Encounter  Procedures   Ambulatory referral to Obstetrics / Gynecology    Referral Priority:  Routine    Referral Type:   Consultation    Referral Reason:   Specialty Services Required    Requested Specialty:   Obstetrics and Gynecology    Number of Visits Requested:   1   POCT Urinalysis Dipstick (Automated)   Lab Orders         POCT Urinalysis Dipstick (Automated)     No images are attached to the encounter or orders placed in the encounter.  Return in about 9 months (around 09/25/2023) for post-partum follow up .   Salvatore Decent, FNP

## 2023-01-20 ENCOUNTER — Other Ambulatory Visit (INDEPENDENT_AMBULATORY_CARE_PROVIDER_SITE_OTHER): Payer: Medicaid Other

## 2023-01-20 ENCOUNTER — Ambulatory Visit: Payer: Medicaid Other | Admitting: *Deleted

## 2023-01-20 VITALS — BP 131/75 | HR 96 | Wt 134.0 lb

## 2023-01-20 DIAGNOSIS — O3680X0 Pregnancy with inconclusive fetal viability, not applicable or unspecified: Secondary | ICD-10-CM

## 2023-01-20 DIAGNOSIS — Z3481 Encounter for supervision of other normal pregnancy, first trimester: Secondary | ICD-10-CM | POA: Diagnosis not present

## 2023-01-20 DIAGNOSIS — Z3A10 10 weeks gestation of pregnancy: Secondary | ICD-10-CM

## 2023-01-20 DIAGNOSIS — O0990 Supervision of high risk pregnancy, unspecified, unspecified trimester: Secondary | ICD-10-CM | POA: Insufficient documentation

## 2023-01-20 DIAGNOSIS — O34219 Maternal care for unspecified type scar from previous cesarean delivery: Secondary | ICD-10-CM | POA: Insufficient documentation

## 2023-01-20 NOTE — Progress Notes (Signed)
Chart reviewed - agree with CMA/RN documentation.  ° °

## 2023-01-20 NOTE — Progress Notes (Signed)
New OB Intake  I explained I am completing New OB Intake today. We discussed EDD of 08/16/2023, by Last Menstrual Period. Pt is G3P1011. I reviewed her allergies, medications and Medical/Surgical/OB history.    Patient Active Problem List   Diagnosis Date Noted   Previous cesarean delivery affecting pregnancy, antepartum 01/20/2023   Supervision of high risk pregnancy, antepartum 01/20/2023    Concerns addressed today  Patient informed that the ultrasound is considered a limited obstetric ultrasound and is not intended to be a complete ultrasound exam.  Patient also informed that the ultrasound is not being completed with the intent of assessing for fetal or placental anomalies or any pelvic abnormalities. Explained that the purpose of today's ultrasound is to assess for viability.  Patient acknowledges the purpose of the exam and the limitations of the study.     Delivery Plans Plans to deliver at [facility]. Discussed the nature of our practice with multiple providers including residents and students. Due to the size of the practice, the delivering provider may not be the same as those providing prenatal care.   MyChart/Babyscripts MyChart access verified. I explained pt will have some visits in office and some virtually. Babyscripts app discussed and ordered.   Blood Pressure Cuff Blood pressure cuff discussed and given. Discussed to be used for virtual visits and or if needed BP checks weekly.  Anatomy US Explained first scheduled [place] will be around 19 weeks.   Last Pap Last Pap 2021  First visit review I reviewed new OB appt with patient. Explained pt will be seen by Dr Berton Lan at first visit. Discussed Avelina Laine genetic screening with patient and would like to get panorama. Routine prenatal labs to be collected at [facility].    Scheryl Marten, RN 01/20/2023  11:46 AM

## 2023-01-23 ENCOUNTER — Encounter: Payer: Medicaid Other | Admitting: Internal Medicine

## 2023-02-03 ENCOUNTER — Other Ambulatory Visit (HOSPITAL_COMMUNITY)
Admission: RE | Admit: 2023-02-03 | Discharge: 2023-02-03 | Disposition: A | Discharge status: Home / Self Care | Payer: Medicaid Other | Source: Ambulatory Visit | Attending: Obstetrics and Gynecology | Admitting: Obstetrics and Gynecology

## 2023-02-03 ENCOUNTER — Encounter: Payer: Self-pay | Admitting: Obstetrics and Gynecology

## 2023-02-03 ENCOUNTER — Ambulatory Visit (INDEPENDENT_AMBULATORY_CARE_PROVIDER_SITE_OTHER): Payer: Medicaid Other | Admitting: Obstetrics and Gynecology

## 2023-02-03 VITALS — BP 118/79 | HR 87 | Wt 138.0 lb

## 2023-02-03 DIAGNOSIS — Z98891 History of uterine scar from previous surgery: Secondary | ICD-10-CM

## 2023-02-03 DIAGNOSIS — Z8659 Personal history of other mental and behavioral disorders: Secondary | ICD-10-CM

## 2023-02-03 DIAGNOSIS — Z3481 Encounter for supervision of other normal pregnancy, first trimester: Secondary | ICD-10-CM | POA: Insufficient documentation

## 2023-02-03 DIAGNOSIS — O0990 Supervision of high risk pregnancy, unspecified, unspecified trimester: Secondary | ICD-10-CM

## 2023-02-03 NOTE — Progress Notes (Signed)
Subjective:   Priscilla Phillips is a 27 y.o. G3P1011 at [redacted]w[redacted]d by LMP c/w 10wk [place] being seen today for her first obstetrical visit.  Her obstetrical history is significant for  prior CS . Pregnancy history fully reviewed.  Patient reports  frequent headaches . Occurring around 3x/wk. Improves with tylenol. Feels she isn't drinking as much water as she could be. She also is a regular caffeine drinker and has been trying to cut back.   HISTORY: OB History  Gravida Para Term Preterm AB Living  3 1 1  0 1 1  SAB IAB Ectopic Multiple Live Births  1 0 0 0 1    # Outcome Date GA Lbr Len/2nd Weight Sex Type Anes PTL Lv  3 Current           2 Term 08/09/21 [redacted]w[redacted]d  6 lb 1 oz (2.75 kg) M CS-LTranv EPI  LIV     Name: Micheli,BOY Marqueta     Apgar1: 7  Apgar5: 8  1 SAB 05/2018 [redacted]w[redacted]d           Last pap smear: No results found for: "DIAGPAP", "HPV", "HPVHIGH"  Past Medical History:  Diagnosis Date   Anxiety    Ovarian cyst    Trichomonas infection    UTI (urinary tract infection)    Past Surgical History:  Procedure Laterality Date   CESAREAN SECTION N/A 08/09/2021   Procedure: CESAREAN SECTION;  Surgeon: Hermina Staggers, MD;  Location: [facility] LD [facility]S;  Service: Obstetrics;  Laterality: N/A;   Family History  Problem Relation Age of Onset   Cancer Mother        skin   Healthy Father    Diabetes Maternal Grandmother    Hypertension Maternal Grandmother    Social History   Tobacco Use   Smoking status: Never   Smokeless tobacco: Never  Vaping Use   Vaping status: Former  Substance Use Topics   Alcohol use: Not Currently    Comment: occ   Drug use: Not Currently   No Known Allergies Current Outpatient Medications on File Prior to Visit  Medication Sig Dispense Refill   Prenatal Vit-Fe Fumarate-FA (PRENATAL MULTIVITAMIN) TABS tablet Take 1 tablet by mouth daily at 12 noon.     No current facility-administered medications on file prior to visit.   Exam   Vitals:   02/03/23  1000  BP: 118/79  Pulse: 87  Weight: 138 lb (62.6 kg)   General:  Alert, oriented and cooperative. Patient is in no acute distress.  Breast: deferred  Cardiovascular: Normal heart rate noted  Respiratory: Normal respiratory effort, no problems with respiration noted  Abdomen: Soft, nontender Pain/Pressure: Absent     Pelvic: Performed in presence of chaperone. NEFG. Normal cervix.         Extremities: Normal range of motion.  Edema: None  Mental Status: Normal mood and affect. Normal behavior. Normal judgment and thought content.    Assessment:   Pregnancy: G3P1011 Patient Active Problem List   Diagnosis Date Noted   Previous cesarean delivery affecting pregnancy, antepartum 01/20/2023   Supervision of high risk pregnancy, antepartum 01/20/2023   Plan:  1. Encounter for supervision of other normal pregnancy in first trimester Initial labs drawn. Flu shot discussed, patient considering Continue prenatal vitamins. Genetic Screening discussed: NIPS, carrier screening and AFP, ordered. Ultrasound discussed; fetal anatomic survey: ordered. Problem list reviewed and updated. The nature of [facility] with multiple MDs and other  Advanced Practice Providers was explained to patient; also emphasized that residents, students are part of our team. Routine obstetric precautions reviewed. - [place] MFM OB DETAIL +14 WK; Future - Cytology - PAP - CBC/D/Plt+RPR+Rh+ABO+RubIgG... - Culture, OB Urine - PANORAMA PRENATAL TEST - HORIZON CUSTOM  2. History of C-section G2 NRFHT Desires repeat CS Does not plan any more children after this pregnancy  3. History of anxiety Feels symptoms are currently controlled No current medications, is not following with [facility] and declines referral at this time  Return in about 4 weeks (around 03/03/2023) for return OB at 16 weeks.  Harvie Bridge, MD Obstetrician & Gynecologist, [facility] for [employer], [facility]

## 2023-02-03 NOTE — Progress Notes (Signed)
ROB: Previously headaches

## 2023-02-03 NOTE — Patient Instructions (Signed)
Up to 200-400mg  of caffeine is OK in pregnancy If you have a headache, try drinking something with a little caffeine, eat a snack and drink water. If that doesn't help, you can take 1-2 extra strength tylenol.

## 2023-02-04 LAB — CBC/D/PLT+RPR+RH+ABO+RUBIGG...
Antibody Screen: NEGATIVE
Basophils Absolute: 0 10*3/uL (ref 0.0–0.2)
Basos: 0 %
EOS (ABSOLUTE): 0.1 10*3/uL (ref 0.0–0.4)
Eos: 1 %
HCV Ab: NONREACTIVE
HIV Screen 4th Generation wRfx: NONREACTIVE
Hematocrit: 42.4 % (ref 34.0–46.6)
Hemoglobin: 14 g/dL (ref 11.1–15.9)
Hepatitis B Surface Ag: NEGATIVE
Immature Grans (Abs): 0.1 10*3/uL (ref 0.0–0.1)
Immature Granulocytes: 1 %
Lymphocytes Absolute: 2.1 10*3/uL (ref 0.7–3.1)
Lymphs: 21 %
MCH: 30.2 pg (ref 26.6–33.0)
MCHC: 33 g/dL (ref 31.5–35.7)
MCV: 92 fL (ref 79–97)
Monocytes Absolute: 0.5 10*3/uL (ref 0.1–0.9)
Monocytes: 5 %
Neutrophils Absolute: 7.1 10*3/uL — ABNORMAL HIGH (ref 1.4–7.0)
Neutrophils: 72 %
Platelets: 253 10*3/uL (ref 150–450)
RBC: 4.63 x10E6/uL (ref 3.77–5.28)
RDW: 12.7 % (ref 11.7–15.4)
RPR Ser Ql: NONREACTIVE
Rh Factor: POSITIVE
Rubella Antibodies, IGG: 1.52 {index} (ref >0.99)
WBC: 9.9 10*3/uL (ref 3.4–10.8)

## 2023-02-04 LAB — HCV INTERPRETATION

## 2023-02-05 LAB — CULTURE, OB URINE

## 2023-02-05 LAB — URINE CULTURE, OB REFLEX

## 2023-02-08 LAB — CYTOLOGY - PAP
Chlamydia: NEGATIVE
Comment: NEGATIVE
Comment: NEGATIVE
Comment: NORMAL
Diagnosis: NEGATIVE
High risk HPV: NEGATIVE
Neisseria Gonorrhea: NEGATIVE

## 2023-02-10 LAB — PANORAMA PRENATAL TEST FULL PANEL:PANORAMA TEST PLUS 5 ADDITIONAL MICRODELETIONS: FETAL FRACTION: 9.9

## 2023-02-14 ENCOUNTER — Encounter: Payer: Self-pay | Admitting: Obstetrics and Gynecology

## 2023-02-14 DIAGNOSIS — Z148 Genetic carrier of other disease: Secondary | ICD-10-CM | POA: Insufficient documentation

## 2023-02-14 LAB — HORIZON CUSTOM: REPORT SUMMARY: POSITIVE — AB

## 2023-03-09 ENCOUNTER — Ambulatory Visit: Payer: Medicaid Other | Admitting: Family Medicine

## 2023-03-09 VITALS — BP 121/73 | HR 91 | Wt 142.0 lb

## 2023-03-09 DIAGNOSIS — Z3A17 17 weeks gestation of pregnancy: Secondary | ICD-10-CM

## 2023-03-09 DIAGNOSIS — O34219 Maternal care for unspecified type scar from previous cesarean delivery: Secondary | ICD-10-CM

## 2023-03-09 DIAGNOSIS — Z148 Genetic carrier of other disease: Secondary | ICD-10-CM

## 2023-03-09 DIAGNOSIS — O0990 Supervision of high risk pregnancy, unspecified, unspecified trimester: Secondary | ICD-10-CM

## 2023-03-09 MED ORDER — PRENATAL 28-0.8 MG PO TABS: 1.0000 | ORAL_TABLET | Freq: Every day | ORAL | 12 refills | Status: AC

## 2023-03-09 NOTE — Progress Notes (Signed)
   PRENATAL VISIT NOTE  Subjective:  Priscilla Phillips is a 27 y.o. G3P1011 at [redacted]w[redacted]d being seen today for ongoing prenatal care.  She is currently monitored for the following issues for this low-risk pregnancy and has Previous cesarean delivery affecting pregnancy, antepartum; Supervision of high risk pregnancy, antepartum; and Carrier of spinal muscular atrophy on their problem list.  Patient reports no complaints.  Contractions: Not present. Vag. Bleeding: None.  Movement: Absent. Denies leaking of fluid.   The following portions of the patient's history were reviewed and updated as appropriate: allergies, current medications, past family history, past medical history, past social history, past surgical history and problem list.   Objective:   Vitals:   03/09/23 1449  BP: 121/73  Pulse: 91  Weight: 142 lb (64.4 kg)    Fetal Status: Fetal Heart Rate (bpm): 142   Movement: Absent     General:  Alert, oriented and cooperative. Patient is in no acute distress.  Skin: Skin is warm and dry. No rash noted.   Cardiovascular: Normal heart rate noted  Respiratory: Normal respiratory effort, no problems with respiration noted  Abdomen: Soft, gravid, appropriate for gestational age.  Pain/Pressure: Absent     Pelvic: Cervical exam deferred        Extremities: Normal range of motion.  Edema: None  Mental Status: Normal mood and affect. Normal behavior. Normal judgment and thought content.   Assessment and Plan:  Pregnancy: G3P1011 at [redacted]w[redacted]d 1. [redacted] weeks gestation of pregnancy (Primary)  2. Supervision of high risk pregnancy, antepartum FHT normal Partner testing offered  3. Previous cesarean delivery affecting pregnancy, antepartum Desires RLTCS  Preterm labor symptoms and general obstetric precautions including but not limited to vaginal bleeding, contractions, leaking of fluid and fetal movement were reviewed in detail with the patient. Please refer to After Visit Summary for other  counseling recommendations.   No follow-ups on file.  Future Appointments  Date Time Provider Department Center  03/24/2023  2:15 PM [facility] NURSE [facility]  03/24/2023  2:30 PM [facility] US4 [facility]  04/13/2023  2:30 PM Levie Heritage, DO [facility] None  05/11/2023  2:30 PM Levie Heritage, DO [facility] None  09/25/2023  2:00 PM Salvatore Decent, FNP [facility]    Levie Heritage, DO

## 2023-03-09 NOTE — Addendum Note (Signed)
Addended by: Mikey Bussing on: 03/09/2023 03:15 PM   Modules accepted: Orders

## 2023-03-15 ENCOUNTER — Encounter: Payer: Self-pay | Admitting: Family Medicine

## 2023-03-17 ENCOUNTER — Encounter: Payer: Self-pay | Admitting: Family Medicine

## 2023-03-19 ENCOUNTER — Emergency Department (HOSPITAL_BASED_OUTPATIENT_CLINIC_OR_DEPARTMENT_OTHER)
Admission: EM | Admit: 2023-03-19 | Discharge: 2023-03-19 | Disposition: A | Discharge status: Home / Self Care | Payer: Medicaid Other | Attending: Emergency Medicine | Admitting: Emergency Medicine

## 2023-03-19 ENCOUNTER — Encounter (HOSPITAL_BASED_OUTPATIENT_CLINIC_OR_DEPARTMENT_OTHER): Payer: Self-pay | Admitting: Emergency Medicine

## 2023-03-19 ENCOUNTER — Other Ambulatory Visit: Payer: Self-pay

## 2023-03-19 ENCOUNTER — Ambulatory Visit
Admission: EM | Admit: 2023-03-19 | Discharge: 2023-03-19 | Disposition: A | Discharge status: Home / Self Care | Payer: Medicaid Other | Attending: Family Medicine | Admitting: Family Medicine

## 2023-03-19 DIAGNOSIS — R519 Headache, unspecified: Secondary | ICD-10-CM | POA: Insufficient documentation

## 2023-03-19 DIAGNOSIS — B001 Herpesviral vesicular dermatitis: Secondary | ICD-10-CM

## 2023-03-19 DIAGNOSIS — O99412 Diseases of the circulatory system complicating pregnancy, second trimester: Secondary | ICD-10-CM | POA: Diagnosis not present

## 2023-03-19 DIAGNOSIS — R079 Chest pain, unspecified: Secondary | ICD-10-CM | POA: Insufficient documentation

## 2023-03-19 DIAGNOSIS — O98512 Other viral diseases complicating pregnancy, second trimester: Secondary | ICD-10-CM

## 2023-03-19 DIAGNOSIS — M791 Myalgia, unspecified site: Secondary | ICD-10-CM | POA: Insufficient documentation

## 2023-03-19 DIAGNOSIS — R52 Pain, unspecified: Secondary | ICD-10-CM

## 2023-03-19 DIAGNOSIS — Z1152 Encounter for screening for COVID-19: Secondary | ICD-10-CM | POA: Insufficient documentation

## 2023-03-19 DIAGNOSIS — R0789 Other chest pain: Secondary | ICD-10-CM | POA: Diagnosis not present

## 2023-03-19 DIAGNOSIS — Z3A18 18 weeks gestation of pregnancy: Secondary | ICD-10-CM | POA: Diagnosis not present

## 2023-03-19 LAB — CBC WITH DIFFERENTIAL/PLATELET
Abs Immature Granulocytes: 0.1 10*3/uL — ABNORMAL HIGH (ref 0.00–0.07)
Basophils Absolute: 0 10*3/uL (ref 0.0–0.1)
Basophils Relative: 0 %
Eosinophils Absolute: 0 10*3/uL (ref 0.0–0.5)
Eosinophils Relative: 0 %
HCT: 34.7 % — ABNORMAL LOW (ref 36.0–46.0)
Hemoglobin: 11.9 g/dL — ABNORMAL LOW (ref 12.0–15.0)
Immature Granulocytes: 1 %
Lymphocytes Relative: 9 %
Lymphs Abs: 1.7 10*3/uL (ref 0.7–4.0)
MCH: 30.7 pg (ref 26.0–34.0)
MCHC: 34.3 g/dL (ref 30.0–36.0)
MCV: 89.4 fL (ref 80.0–100.0)
Monocytes Absolute: 0.6 10*3/uL (ref 0.1–1.0)
Monocytes Relative: 3 %
Neutro Abs: 16.4 10*3/uL — ABNORMAL HIGH (ref 1.7–7.7)
Neutrophils Relative %: 87 %
Platelets: 194 10*3/uL (ref 150–400)
RBC: 3.88 MIL/uL (ref 3.87–5.11)
RDW: 12.9 % (ref 11.5–15.5)
WBC: 18.8 10*3/uL — ABNORMAL HIGH (ref 4.0–10.5)
nRBC: 0 % (ref 0.0–0.2)

## 2023-03-19 LAB — RESP PANEL BY RT-PCR (RSV, FLU A&B, COVID)  RVPGX2
Influenza A by PCR: NEGATIVE
Influenza B by PCR: NEGATIVE
Resp Syncytial Virus by PCR: NEGATIVE
SARS Coronavirus 2 by RT PCR: NEGATIVE

## 2023-03-19 LAB — COMPREHENSIVE METABOLIC PANEL
ALT: 10 U/L (ref 0–44)
AST: 15 U/L (ref 15–41)
Albumin: 3.2 g/dL — ABNORMAL LOW (ref 3.5–5.0)
Alkaline Phosphatase: 41 U/L (ref 38–126)
Anion gap: 8 (ref 5–15)
BUN: 9 mg/dL (ref 6–20)
CO2: 21 mmol/L — ABNORMAL LOW (ref 22–32)
Calcium: 8.8 mg/dL — ABNORMAL LOW (ref 8.9–10.3)
Chloride: 105 mmol/L (ref 98–111)
Creatinine, Ser: 0.48 mg/dL (ref 0.44–1.00)
GFR, Estimated: >60 mL/min (ref >60)
Glucose, Bld: 105 mg/dL — ABNORMAL HIGH (ref 70–99)
Potassium: 3.4 mmol/L — ABNORMAL LOW (ref 3.5–5.1)
Sodium: 134 mmol/L — ABNORMAL LOW (ref 135–145)
Total Bilirubin: 0.6 mg/dL (ref <1.2)
Total Protein: 6.9 g/dL (ref 6.5–8.1)

## 2023-03-19 LAB — POC COVID19/FLU A&B COMBO
Covid Antigen, POC: NEGATIVE
Influenza A Antigen, POC: NEGATIVE
Influenza B Antigen, POC: NEGATIVE

## 2023-03-19 LAB — TROPONIN I (HIGH SENSITIVITY): Troponin I (High Sensitivity): <2 ng/L (ref <18)

## 2023-03-19 MED ORDER — VALACYCLOVIR HCL 1 G PO TABS: ORAL_TABLET | ORAL | 0 refills | Status: DC

## 2023-03-19 NOTE — ED Provider Notes (Signed)
[place] [facility] AT [facility] Provider Note   CSN: [id number] Arrival date & time: 03/19/23  1726     History {Add pertinent medical, surgical, social history, OB history to HPI:1} Chief Complaint  Patient presents with   Chest Pain    Priscilla Phillips is a 27 y.o. female.  HPI     G3P1 18wk   This morning developed pain middle of chest 44 year old son with slight cold, sleep near eachother Chest aching, dull pain like a sore throat but when breathing like dull pain with breathing, when walking or trying to talk it hurts, like a dull pain. If walking or talking it hurts. Never experienced anything like this.  If just sitting and breathing normally it hurts. No cough, no congestion, no sore throat, no vomiting, has normal nausea with this pregnancy.  No pain with urination. No shortness of breath No known fever, no chills Headaches, arm pain, low back pain, more body aches today but has had some headaches during this pregnancy. Sees Dr. Adrian Blackwater. No vb, leaking of fluid, abd pain   Past Medical History:  Diagnosis Date   Anxiety    Ovarian cyst    Trichomonas infection    UTI (urinary tract infection)      Home Medications Prior to Admission medications   Medication Sig Start Date End Date Taking? Authorizing Provider  acetaminophen (TYLENOL) 500 MG tablet Take 500 mg by mouth every 6 (six) hours as needed.    [provider]  Prenatal 28-0.8 MG TABS Take 1 tablet by mouth daily. 03/09/23   Levie Heritage, DO  Prenatal Vit-Fe Fumarate-FA (PRENATAL MULTIVITAMIN) TABS tablet Take 1 tablet by mouth daily at 12 noon.    [provider]  valACYclovir (VALTREX) 1000 MG tablet At the start of an outbreak take 1 tablet daily for 5 days. 03/19/23   Wallis Bamberg, PA-C      Allergies    Patient has no known allergies.    Review of Systems   Review of Systems  Physical Exam Updated Vital Signs BP 126/83   Pulse (!) 116    Temp 98.4 F (36.9 C) (Oral)   Resp 18   LMP 11/09/2022   SpO2 99%  Physical Exam  ED Results / Procedures / Treatments   Labs (all labs ordered are listed, but only abnormal results are displayed) Labs Reviewed  CBC WITH DIFFERENTIAL/PLATELET - Abnormal; Notable for the following components:      Result Value   WBC 18.8 (*)    Hemoglobin 11.9 (*)    HCT 34.7 (*)    Neutro Abs 16.4 (*)    Abs Immature Granulocytes 0.10 (*)    All other components within normal limits  RESP PANEL BY RT-PCR (RSV, FLU A&B, COVID)  [id number]  COMPREHENSIVE METABOLIC PANEL  TROPONIN I (HIGH SENSITIVITY)  TROPONIN I (HIGH SENSITIVITY)    EKG EKG Interpretation Date/Time:  Sunday March 19 2023 17:36:41 EST Ventricular Rate:  116 PR Interval:  157 QRS Duration:  78 QT Interval:  311 QTC Calculation: 432 R Axis:   82  Text Interpretation: Sinus tachycardia Consider right atrial enlargement Borderline T abnormalities, diffuse leads No previous ECGs available Confirmed by Alvira Monday ([id number]) on 03/19/2023 6:18:49 PM  Radiology No results found.  Procedures Procedures  {Document cardiac monitor, telemetry assessment procedure when appropriate:1}  Medications Ordered in ED Medications - No data to display  ED Course/ Medical Decision Making/ A&P   {  Click here for ABCD2, HEART and other calculatorsREFRESH Note before signing :1}                              Medical Decision Making Amount and/or Complexity of Data Reviewed Labs: ordered.   ***  {Document critical care time when appropriate:1} {Document review of labs and clinical decision tools ie heart score, Chads2Vasc2 etc:1}  {Document your independent review of radiology images, and any outside records:1} {Document your discussion with family members, caretakers, and with consultants:1} {Document social determinants of health affecting pt's care:1} {Document your decision making why or why not admission, treatments were  needed:1} Final Clinical Impression(s) / ED Diagnoses Final diagnoses:  None    Rx / DC Orders ED Discharge Orders     None

## 2023-03-19 NOTE — Discharge Instructions (Addendum)
It is reasonable to take the valacyclovir prescribed at urgent care.  Please return to the ED if you develop shortness of breath, worsening pain or other concerns.  You may have a viral infection with body aches and sick contact, muscle pain with the tenderness you have. Reflux can also cause pain. We discussed possible blood clot work up and have deferred at this time with low suspicion for blood clots but if you develop any other concerning symptoms or changes please return to the ED>

## 2023-03-19 NOTE — Discharge Instructions (Signed)
Please present to the emergency room now as I am concerned that you need rule out of a pulmonary embolism in pregnancy. This is a primary concern given your negative COVID flu test and the shortness of breath, chest pain, tachycardia you are experiencing.

## 2023-03-19 NOTE — ED Provider Notes (Signed)
[facility] - URGENT CARE CENTER  Note:  This document was prepared using Conservation officer, historic buildings and may include unintentional dictation errors.  MRN: [id number] DOB: October 17, 1995  Subjective:   Priscilla Phillips is a 27 y.o. female [redacted] weeks pregnant presenting for acute onset since this morning of body aches, chest pain with deep breathing, shortness of breath with speaking and walking. Has had frontal headaches. No fever, wheezing. Used Tylenol with minimal relief. No asthma.  No history of clotting disorders.  No current facility-administered medications for this encounter.  Current Outpatient Medications:    acetaminophen (TYLENOL) 500 MG tablet, Take 500 mg by mouth every 6 (six) hours as needed., Disp: , Rfl:    Prenatal 28-0.8 MG TABS, Take 1 tablet by mouth daily., Disp: 30 tablet, Rfl: 12   Prenatal Vit-Fe Fumarate-FA (PRENATAL MULTIVITAMIN) TABS tablet, Take 1 tablet by mouth daily at 12 noon., Disp: , Rfl:    No Known Allergies  Past Medical History:  Diagnosis Date   Anxiety    Ovarian cyst    Trichomonas infection    UTI (urinary tract infection)      Past Surgical History:  Procedure Laterality Date   CESAREAN SECTION N/A 08/09/2021   Procedure: CESAREAN SECTION;  Surgeon: Hermina Staggers, MD;  Location: [facility] LD ORS;  Service: Obstetrics;  Laterality: N/A;    Family History  Problem Relation Age of Onset   Cancer Mother        skin   Healthy Father    Diabetes Maternal Grandmother    Hypertension Maternal Grandmother     Social History   Tobacco Use   Smoking status: Never   Smokeless tobacco: Never  Vaping Use   Vaping status: Former  Substance Use Topics   Alcohol use: Not Currently    Comment: occ   Drug use: Not Currently    ROS   Objective:   Vitals: BP 113/74 (BP Location: Right Arm)   Pulse (!) 114   Temp 99.2 F (37.3 C) (Oral)   Resp 18   LMP 11/09/2022   SpO2 96%   Breastfeeding No   Physical  Exam Constitutional:      General: She is not in acute distress.    Appearance: Normal appearance. She is well-developed and normal weight. She is not ill-appearing, toxic-appearing or diaphoretic.  HENT:     Head: Normocephalic and atraumatic.     Right Ear: Tympanic membrane, ear canal and external ear normal. No drainage or tenderness. No middle ear effusion. There is no impacted cerumen. Tympanic membrane is not erythematous or bulging.     Left Ear: Tympanic membrane, ear canal and external ear normal. No drainage or tenderness.  No middle ear effusion. There is no impacted cerumen. Tympanic membrane is not erythematous or bulging.     Nose: Nose normal. No congestion or rhinorrhea.     Mouth/Throat:     Mouth: Mucous membranes are moist. No oral lesions.     Pharynx: No pharyngeal swelling, oropharyngeal exudate, posterior oropharyngeal erythema or uvula swelling.     Tonsils: No tonsillar exudate or tonsillar abscesses.  Eyes:     General: No scleral icterus.       Right eye: No discharge.        Left eye: No discharge.     Extraocular Movements: Extraocular movements intact.     Right eye: Normal extraocular motion.     Left eye: Normal extraocular motion.     Conjunctiva/sclera: Conjunctivae  normal.  Neck:     Meningeal: Brudzinski's sign and Kernig's sign absent.  Cardiovascular:     Rate and Rhythm: Normal rate and regular rhythm.     Heart sounds: Normal heart sounds. No murmur heard.    No friction rub. No gallop.  Pulmonary:     Effort: Pulmonary effort is normal. No respiratory distress.     Breath sounds: No stridor. No wheezing, rhonchi or rales.  Chest:     Chest wall: No tenderness.  Musculoskeletal:     Cervical back: Normal range of motion and neck supple.  Lymphadenopathy:     Cervical: No cervical adenopathy.  Skin:    General: Skin is warm and dry.  Neurological:     General: No focal deficit present.     Mental Status: She is alert and oriented to  person, place, and time.     Cranial Nerves: No cranial nerve deficit, dysarthria or facial asymmetry.     Motor: No weakness or pronator drift.     Coordination: Romberg sign negative. Coordination normal. Finger-Nose-Finger Test and Heel to [facility] Test normal. Rapid alternating movements normal.     Gait: Gait and tandem walk normal.     Deep Tendon Reflexes: Reflexes normal.  Psychiatric:        Mood and Affect: Mood normal.        Behavior: Behavior normal.        Thought Content: Thought content normal.        Judgment: Judgment normal.     Results for orders placed or performed during the hospital encounter of 03/19/23 (from the past 24 hours)  POC Covid + Flu A/B Antigen     Status: None   Collection Time: 03/19/23  3:12 PM  Result Value Ref Range   Influenza A Antigen, POC Negative Negative   Influenza B Antigen, POC Negative Negative   Covid Antigen, POC Negative Negative    Assessment and Plan :   PDMP not reviewed this encounter.  1. Viral infectious disease in mother during second trimester of pregnancy   2. Fever blister    Recommended starting valacyclovir for her cold sore, fever blister.  Discussed appropriate use of valacyclovir going forward. Suspect viral URI, viral syndrome. Physical exam findings reassuring and vital signs stable for discharge. Advised supportive care, offered symptomatic relief.    Recommend evaluation through the emergency room for rule out of pulmonary embolism in pregnancy due to shortness of breath, chest pain, tachycardia.    Wallis Bamberg, PA-C 03/19/23 [id number]

## 2023-03-19 NOTE — ED Triage Notes (Signed)
Mid chest sharp pain started when deep breathing this morning . Denies cough or shortness of breath , 18 wks preg . Felt baby movement today .

## 2023-03-19 NOTE — ED Triage Notes (Signed)
Pt reports talking and taking deep breath gives chest pain, shortness of breath, headaches, body aches since this morning. Tylenol gives no relief.    Reports she is [redacted] weeks pregnant.

## 2023-03-20 ENCOUNTER — Telehealth: Payer: Self-pay

## 2023-03-20 NOTE — Transitions of Care (Post Inpatient/ED Visit) (Signed)
   03/20/2023  Name: Priscilla Phillips MRN: [id number] DOB: [birth year]  Today's TOC FU Call Status: Today's TOC FU Call Status:: Successful TOC FU Call Completed Unsuccessful Call (1st Attempt) Date: 03/20/23 [facility] FU Call Complete Date: 03/20/23 Patient's Name and Date of Birth confirmed.  Transition Care Management Follow-up Telephone Call Date of Discharge: 03/19/23 Discharge Facility: [facility] Type of Discharge: Emergency Department Reason for ED Visit: Cardiac Conditions (chest pain) Cardiac Conditions Diagnosis: Chest Pain Persisting How have you been since you were released from the hospital?: Better Any questions or concerns?: No  Items Reviewed: Did you receive and understand the discharge instructions provided?: Yes Medications obtained,verified, and reconciled?: Yes (Medications Reviewed) Any new allergies since your discharge?: No Dietary orders reviewed?: No Do you have support at home?: No  Medications Reviewed Today: Medications Reviewed Today     Reviewed by Leroy Kennedy, CMA (Certified Medical Assistant) on 03/20/23 at 1150  Med List Status: <None>   Medication Order Taking? Sig Documenting Provider Last Dose Status Informant  acetaminophen (TYLENOL) 500 MG tablet [id number] Yes Take 500 mg by mouth every 6 (six) hours as needed. [provider] Taking Active   Prenatal 28-0.8 MG TABS [id number] Yes Take 1 tablet by mouth daily. Levie Heritage, DO Taking Active   Prenatal Vit-Fe Fumarate-FA (PRENATAL MULTIVITAMIN) TABS tablet [id number] Yes Take 1 tablet by mouth daily at 12 noon. [provider] Taking Active   valACYclovir (VALTREX) 1000 MG tablet [id number] Yes At the start of an outbreak take 1 tablet daily for 5 days. Wallis Bamberg, PA-C Taking Active             Home Care and Equipment/Supplies: Were Home Health Services Ordered?: No Any new equipment or medical supplies ordered?: No  Functional Questionnaire: Do you  need assistance with bathing/showering or dressing?: No Do you need assistance with meal preparation?: No Do you need assistance with eating?: No Do you have difficulty maintaining continence: No Do you need assistance with getting out of bed/getting out of a chair/moving?: No Do you have difficulty managing or taking your medications?: No  Follow up appointments reviewed: PCP Follow-up appointment confirmed?: NA Specialist Hospital Follow-up appointment confirmed?: No Reason Specialist Follow-Up Not Confirmed: Patient has Specialist Provider Number and will Call for Appointment Do you need transportation to your follow-up appointment?: No Do you understand care options if your condition(s) worsen?: Yes-patient verbalized understanding    SIGNATURE Joshu Furukawa RMA

## 2023-03-20 NOTE — Telephone Encounter (Signed)
E2C2 called with pt on the other line to speak with Ashlee, as pt was returning a call per Transition of care from ED. Pt wants a call back from nurse at [phone]

## 2023-03-20 NOTE — Telephone Encounter (Signed)
Copied from CRM [id number]. Topic: Clinical - Medical Advice >> Mar 20, 2023 11:27 AM Tiffany H wrote: Reason for CRM: Patient called back in response to Transitions of Care callback from [facility]. Called CAL, Maxey Ransom wasn't available. Please assist.

## 2023-03-20 NOTE — Telephone Encounter (Signed)
Returned patient phone call to complete TOC. Patient stated she will follow up with [facility] due to pregnancy; she is not seeing Salvatore Decent at the time

## 2023-03-20 NOTE — Transitions of Care (Post Inpatient/ED Visit) (Signed)
   03/20/2023  Name: Priscilla Phillips MRN: [id number] DOB: 11-Aug-1995  Today's TOC FU Call Status: Today's TOC FU Call Status:: Unsuccessful Call (1st Attempt) Unsuccessful Call (1st Attempt) Date: 03/20/23  Attempted to reach the patient regarding the most recent Inpatient/ED visit.  Follow Up Plan: Additional outreach attempts will be made to reach the patient to complete the Transitions of Care (Post Inpatient/ED visit) call.   [employer]

## 2023-03-20 NOTE — Telephone Encounter (Signed)
Spoke to patient and completed TOC

## 2023-03-24 ENCOUNTER — Ambulatory Visit: Payer: Medicaid Other | Attending: Obstetrics and Gynecology

## 2023-03-24 ENCOUNTER — Ambulatory Visit: Payer: Medicaid Other | Admitting: *Deleted

## 2023-03-24 VITALS — BP 114/67 | HR 98

## 2023-03-24 DIAGNOSIS — O0990 Supervision of high risk pregnancy, unspecified, unspecified trimester: Secondary | ICD-10-CM | POA: Diagnosis not present

## 2023-03-24 DIAGNOSIS — O34219 Maternal care for unspecified type scar from previous cesarean delivery: Secondary | ICD-10-CM | POA: Diagnosis not present

## 2023-03-24 DIAGNOSIS — O285 Abnormal chromosomal and genetic finding on antenatal screening of mother: Secondary | ICD-10-CM

## 2023-03-24 DIAGNOSIS — Z148 Genetic carrier of other disease: Secondary | ICD-10-CM | POA: Diagnosis not present

## 2023-03-24 DIAGNOSIS — Z98891 History of uterine scar from previous surgery: Secondary | ICD-10-CM | POA: Insufficient documentation

## 2023-03-24 DIAGNOSIS — Z3A19 19 weeks gestation of pregnancy: Secondary | ICD-10-CM

## 2023-03-29 ENCOUNTER — Encounter: Payer: Self-pay | Admitting: Family Medicine

## 2023-03-30 ENCOUNTER — Encounter: Payer: Self-pay | Admitting: *Deleted

## 2023-04-13 ENCOUNTER — Ambulatory Visit (INDEPENDENT_AMBULATORY_CARE_PROVIDER_SITE_OTHER): Payer: Medicaid Other | Admitting: Family Medicine

## 2023-04-13 VITALS — BP 114/66 | HR 96 | Wt 150.0 lb

## 2023-04-13 DIAGNOSIS — Z3A22 22 weeks gestation of pregnancy: Secondary | ICD-10-CM

## 2023-04-13 DIAGNOSIS — O0990 Supervision of high risk pregnancy, unspecified, unspecified trimester: Secondary | ICD-10-CM

## 2023-04-13 DIAGNOSIS — O34219 Maternal care for unspecified type scar from previous cesarean delivery: Secondary | ICD-10-CM

## 2023-04-13 DIAGNOSIS — Z148 Genetic carrier of other disease: Secondary | ICD-10-CM

## 2023-04-14 NOTE — Progress Notes (Signed)
   PRENATAL VISIT NOTE  Subjective:  Priscilla Phillips is a 28 y.o. G3P1011 at [redacted]w[redacted]d being seen today for ongoing prenatal care.  She is currently monitored for the following issues for this low-risk pregnancy and has Previous cesarean delivery affecting pregnancy, antepartum; Supervision of high risk pregnancy, antepartum; and Carrier of spinal muscular atrophy on their problem list.  Patient reports no complaints.  Contractions: Not present. Vag. Bleeding: None.  Movement: Present. Denies leaking of fluid.   The following portions of the patient's history were reviewed and updated as appropriate: allergies, current medications, past family history, past medical history, past social history, past surgical history and problem list.   Objective:   Vitals:   04/13/23 1517  BP: 114/66  Pulse: 96  Weight: 150 lb (68 kg)    Fetal Status: Fetal Heart Rate (bpm): 164   Movement: Present     General:  Alert, oriented and cooperative. Patient is in no acute distress.  Skin: Skin is warm and dry. No rash noted.   Cardiovascular: Normal heart rate noted  Respiratory: Normal respiratory effort, no problems with respiration noted  Abdomen: Soft, gravid, appropriate for gestational age.  Pain/Pressure: Present     Pelvic: Cervical exam deferred        Extremities: Normal range of motion.  Edema: None  Mental Status: Normal mood and affect. Normal behavior. Normal judgment and thought content.   Assessment and Plan:  Pregnancy: G3P1011 at 105w2d 1. [redacted] weeks gestation of pregnancy (Primary)  2. Supervision of high risk pregnancy, antepartum FHT normal  3. Previous cesarean delivery affecting pregnancy, antepartum Desires RLTCS  4. Carrier of spinal muscular atrophy  Preterm labor symptoms and general obstetric precautions including but not limited to vaginal bleeding, contractions, leaking of fluid and fetal movement were reviewed in detail with the patient. Please refer to After Visit  Summary for other counseling recommendations.   No follow-ups on file.  Future Appointments  Date Time Provider Department Center  05/11/2023  2:30 PM Levie Heritage, DO [facility] None  06/08/2023  8:35 AM Levie Heritage, DO [facility] None  09/25/2023  2:00 PM Salvatore Decent, FNP [facility]    Levie Heritage, DO

## 2023-04-21 ENCOUNTER — Encounter: Payer: Self-pay | Admitting: Family Medicine

## 2023-05-11 ENCOUNTER — Ambulatory Visit: Payer: Medicaid Other | Admitting: Family Medicine

## 2023-05-11 VITALS — BP 124/69 | HR 118 | Wt 156.0 lb

## 2023-05-11 DIAGNOSIS — Z3A26 26 weeks gestation of pregnancy: Secondary | ICD-10-CM

## 2023-05-11 DIAGNOSIS — O34219 Maternal care for unspecified type scar from previous cesarean delivery: Secondary | ICD-10-CM | POA: Diagnosis not present

## 2023-05-11 DIAGNOSIS — Z148 Genetic carrier of other disease: Secondary | ICD-10-CM

## 2023-05-11 DIAGNOSIS — O0990 Supervision of high risk pregnancy, unspecified, unspecified trimester: Secondary | ICD-10-CM | POA: Diagnosis not present

## 2023-05-11 NOTE — Progress Notes (Signed)
   PRENATAL VISIT NOTE  Subjective:  Priscilla Phillips is a 28 y.o. G3P1011 at [redacted]w[redacted]d being seen today for ongoing prenatal care.  She is currently monitored for the following issues for this low-risk pregnancy and has Previous cesarean delivery affecting pregnancy, antepartum; Supervision of high risk pregnancy, antepartum; and Carrier of spinal muscular atrophy on their problem list.  Patient reports no complaints.  Contractions: Not present. Vag. Bleeding: None.  Movement: Present. Denies leaking of fluid.   The following portions of the patient's history were reviewed and updated as appropriate: allergies, current medications, past family history, past medical history, past social history, past surgical history and problem list.   Objective:   Vitals:   05/11/23 1448  BP: 124/69  Pulse: (!) 118  Weight: 156 lb (70.8 kg)    Fetal Status: Fetal Heart Rate (bpm): 160   Movement: Present     General:  Alert, oriented and cooperative. Patient is in no acute distress.  Skin: Skin is warm and dry. No rash noted.   Cardiovascular: Normal heart rate noted  Respiratory: Normal respiratory effort, no problems with respiration noted  Abdomen: Soft, gravid, appropriate for gestational age.  Pain/Pressure: Present     Pelvic: Cervical exam deferred        Extremities: Normal range of motion.  Edema: None  Mental Status: Normal mood and affect. Normal behavior. Normal judgment and thought content.   Assessment and Plan:  Pregnancy: G3P1011 at [redacted]w[redacted]d 1. [redacted] weeks gestation of pregnancy (Primary)  2. Supervision of high risk pregnancy, antepartum FHT normal  3. Previous cesarean delivery affecting pregnancy, antepartum Rpt LTCS desired  4. Carrier of spinal muscular atrophy   Preterm labor symptoms and general obstetric precautions including but not limited to vaginal bleeding, contractions, leaking of fluid and fetal movement were reviewed in detail with the patient. Please refer to  After Visit Summary for other counseling recommendations.   No follow-ups on file.  Future Appointments  Date Time Provider Department Center  06/08/2023  8:35 AM Levie Heritage, DO [facility] None  07/03/2023  2:50 PM Shea Evans Gillian Scarce, MD [facility] None  07/18/2023  1:30 PM Gerrit Heck, CNM [facility] None  09/25/2023  2:00 PM Salvatore Decent, FNP [facility]    Levie Heritage, DO

## 2023-05-24 ENCOUNTER — Other Ambulatory Visit: Payer: Medicaid Other

## 2023-05-25 ENCOUNTER — Other Ambulatory Visit: Payer: Medicaid Other

## 2023-05-25 DIAGNOSIS — Z3A26 26 weeks gestation of pregnancy: Secondary | ICD-10-CM | POA: Diagnosis not present

## 2023-05-25 DIAGNOSIS — Z3482 Encounter for supervision of other normal pregnancy, second trimester: Secondary | ICD-10-CM

## 2023-05-25 DIAGNOSIS — Z3492 Encounter for supervision of normal pregnancy, unspecified, second trimester: Secondary | ICD-10-CM | POA: Diagnosis not present

## 2023-05-25 NOTE — Progress Notes (Signed)
 Patient arrived for labs.  Sent to lab. Priscilla Phillips

## 2023-05-27 LAB — CBC
Hematocrit: 34.8 % (ref 34.0–46.6)
Hemoglobin: 11.6 g/dL (ref 11.1–15.9)
MCH: 30.6 pg (ref 26.6–33.0)
MCHC: 33.3 g/dL (ref 31.5–35.7)
MCV: 92 fL (ref 79–97)
Platelets: 182 10*3/uL (ref 150–450)
RBC: 3.79 x10E6/uL (ref 3.77–5.28)
RDW: 12.9 % (ref 11.7–15.4)
WBC: 11 10*3/uL — ABNORMAL HIGH (ref 3.4–10.8)

## 2023-05-27 LAB — GLUCOSE TOLERANCE, 2 HOURS W/ 1HR
Glucose, 1 hour: 192 mg/dL — ABNORMAL HIGH (ref 70–179)
Glucose, 2 hour: 158 mg/dL — ABNORMAL HIGH (ref 70–152)
Glucose, Fasting: 83 mg/dL (ref 70–91)

## 2023-05-27 LAB — HIV ANTIBODY (ROUTINE TESTING W REFLEX): HIV Screen 4th Generation wRfx: NONREACTIVE

## 2023-05-27 LAB — RPR: RPR Ser Ql: NONREACTIVE

## 2023-05-29 ENCOUNTER — Telehealth: Payer: Self-pay

## 2023-05-29 DIAGNOSIS — O24419 Gestational diabetes mellitus in pregnancy, unspecified control: Secondary | ICD-10-CM

## 2023-05-29 MED ORDER — ACCU-CHEK GUIDE TEST VI STRP
ORAL_STRIP | 12 refills | Status: DC
Start: 2023-05-29 — End: 2023-08-10

## 2023-05-29 MED ORDER — ACCU-CHEK GUIDE W/DEVICE KIT: PACK | 0 refills | Status: DC

## 2023-05-29 MED ORDER — ACCU-CHEK SOFTCLIX LANCETS MISC: 12 refills | Status: DC

## 2023-05-29 NOTE — Telephone Encounter (Signed)
-----   Message from Celedonio Savage sent at 05/29/2023  9:52 AM EST ----- Elvina Sidle, this patient failed her GTT. Can we get her supplies and schedule her with the nutritionalist. Thanks!

## 2023-05-29 NOTE — Telephone Encounter (Signed)
 Patient called the office back. Informed patient that she failed her 2 hr GTT and she has Gestational Diabetes. Testing supplies were sent to the pharmacy and Diabetes Education referral has been placed. Understanding was voiced. Melinda Gwinner l Shaquinta Peruski, CMA

## 2023-05-30 ENCOUNTER — Encounter (HOSPITAL_COMMUNITY): Payer: Self-pay | Admitting: Obstetrics & Gynecology

## 2023-05-30 ENCOUNTER — Inpatient Hospital Stay (HOSPITAL_COMMUNITY)

## 2023-05-30 ENCOUNTER — Inpatient Hospital Stay (HOSPITAL_COMMUNITY)
Admission: AD | Admit: 2023-05-30 | Discharge: 2023-05-30 | Disposition: A | Discharge status: Home / Self Care | Attending: Obstetrics & Gynecology | Admitting: Obstetrics & Gynecology

## 2023-05-30 DIAGNOSIS — B3731 Acute candidiasis of vulva and vagina: Secondary | ICD-10-CM | POA: Diagnosis not present

## 2023-05-30 DIAGNOSIS — R102 Pelvic and perineal pain: Secondary | ICD-10-CM

## 2023-05-30 DIAGNOSIS — O4703 False labor before 37 completed weeks of gestation, third trimester: Secondary | ICD-10-CM | POA: Diagnosis not present

## 2023-05-30 DIAGNOSIS — O479 False labor, unspecified: Secondary | ICD-10-CM

## 2023-05-30 DIAGNOSIS — Z0371 Encounter for suspected problem with amniotic cavity and membrane ruled out: Secondary | ICD-10-CM | POA: Diagnosis present

## 2023-05-30 DIAGNOSIS — O0990 Supervision of high risk pregnancy, unspecified, unspecified trimester: Secondary | ICD-10-CM

## 2023-05-30 DIAGNOSIS — O26893 Other specified pregnancy related conditions, third trimester: Secondary | ICD-10-CM

## 2023-05-30 DIAGNOSIS — Z3A28 28 weeks gestation of pregnancy: Secondary | ICD-10-CM

## 2023-05-30 DIAGNOSIS — R1030 Lower abdominal pain, unspecified: Secondary | ICD-10-CM | POA: Diagnosis present

## 2023-05-30 DIAGNOSIS — O98813 Other maternal infectious and parasitic diseases complicating pregnancy, third trimester: Secondary | ICD-10-CM | POA: Diagnosis not present

## 2023-05-30 LAB — URINALYSIS, ROUTINE W REFLEX MICROSCOPIC
Bilirubin Urine: NEGATIVE
Glucose, UA: NEGATIVE mg/dL
Hgb urine dipstick: NEGATIVE
Ketones, ur: NEGATIVE mg/dL
Leukocytes,Ua: NEGATIVE
Nitrite: NEGATIVE
Protein, ur: NEGATIVE mg/dL
Specific Gravity, Urine: 1.016 (ref 1.005–1.030)
pH: 6 (ref 5.0–8.0)

## 2023-05-30 LAB — GC/CHLAMYDIA PROBE AMP (~~LOC~~) NOT AT ARMC
Chlamydia: NEGATIVE
Comment: NEGATIVE
Comment: NORMAL
Neisseria Gonorrhea: NEGATIVE

## 2023-05-30 LAB — WET PREP, GENITAL
Clue Cells Wet Prep HPF POC: NONE SEEN
Sperm: NONE SEEN
Trich, Wet Prep: NONE SEEN
WBC, Wet Prep HPF POC: <10 (ref <10)
Yeast Wet Prep HPF POC: NONE SEEN

## 2023-05-30 LAB — POCT FERN TEST: POCT Fern Test: NEGATIVE

## 2023-05-30 LAB — RUPTURE OF MEMBRANE (ROM)PLUS: Rom Plus: NEGATIVE

## 2023-05-30 MED ORDER — FLUCONAZOLE 150 MG PO TABS: 150.0000 mg | ORAL_TABLET | ORAL | 0 refills | Status: DC | PRN

## 2023-05-30 NOTE — MAU Note (Addendum)
 Pt says at 0055- woke- felt wet- and then saw bed was wet . Feels fluid still coming out- clear. Last sex- mths ago  No problems with urination.  [facility]- [facility]  No pain  Feels pressure lower abd

## 2023-05-30 NOTE — MAU Provider Note (Signed)
 Rule out rupture     S Ms. Priscilla Phillips is a 28 y.o. G11P1011 pregnant female at [redacted]w[redacted]d who presents to MAU today with complaint of ?SROM. Pt states she awoke around 0055 and felt wet.  She noticed the bed was also wet.  She states she still feels fluids coming out and that the fluid is clear.  Coitus months ago but none recently. Denies VB.  Feels more lower abdomen pressure but no CTX.  +FM.    Receives care at [facility]. Prenatal records reviewed.  Pertinent items noted in HPI and remainder of comprehensive ROS otherwise negative.   O BP 121/76   Pulse 99   Temp 97.9 F (36.6 C) (Oral)   Resp 16   Ht 4\' 10"  (1.473 m)   Wt 72.4 kg   LMP 11/09/2022   BMI 33.38 kg/m  Physical Exam Vitals and nursing note reviewed. Exam conducted with a chaperone present.  Constitutional:      Appearance: Normal appearance.  HENT:     Head: Normocephalic and atraumatic.     Right Ear: External ear normal.     Left Ear: External ear normal.     Nose: Nose normal. No congestion.     Mouth/Throat:     Mouth: Mucous membranes are moist.     Pharynx: Oropharynx is clear.  Eyes:     Extraocular Movements: Extraocular movements intact.     Conjunctiva/sclera: Conjunctivae normal.  Cardiovascular:     Rate and Rhythm: Normal rate.  Pulmonary:     Effort: Pulmonary effort is normal. No respiratory distress.  Abdominal:     General: There is no distension.     Tenderness: There is no abdominal tenderness.     Comments: gravid  Genitourinary:    General: Normal vulva.     Vagina: Vaginal discharge (white, no pooling, CVE C/L/H) present.  Musculoskeletal:        General: No swelling. Normal range of motion.     Cervical back: Normal range of motion.  Skin:    General: Skin is warm and dry.  Neurological:     Mental Status: She is alert and oriented to person, place, and time. Mental status is at baseline.     Motor: No weakness.     Gait: Gait normal.  Psychiatric:        Behavior: Behavior  normal.     Comments: anxious   NST: 145bpm, moderate variability, +accels, no decels, no ctx    MDM: MAU Course:  Crist Fat appears to be mostly hyphae however one area of what appears to be overlaying hyphae, however will get ROM plus to r/o rupture.  Will treat for yeast infection.  Collected Wet Prep and GC swabs as well.  UA noninfectious.  [place] for AFI and cervical length ordered.  NST reactive.   Wet Prep neg GC collected  ROM plus negative  OB US = 11.6 AFI, nrl; Cervix appeared normal per [place] tech   AP #[redacted] weeks gestation #Vaginal yeast infection #False labor   Discharge from MAU in stable condition with strict/usual precautions Follow up at [facility] as scheduled for ongoing prenatal care  Allergies as of 05/30/2023   No Known Allergies      Medication List     TAKE these medications    Accu-Chek Guide Test test strip Generic drug: glucose blood DX:O24.419. Check blood sugar four times a day.   Accu-Chek Guide w/Device Kit [id number]. Check blood sugar four times a day.  Accu-Chek Softclix Lancets lancets DX:O24.419. Check blood sugar four times a day.   acetaminophen 500 MG tablet Commonly known as: TYLENOL Take 500 mg by mouth every 6 (six) hours as needed.   fluconazole 150 MG tablet Commonly known as: Diflucan Take 1 tablet (150 mg total) by mouth every 3 (three) days as needed (yeast infection symptoms).   Prenatal 28-0.8 MG Tabs Take 1 tablet by mouth daily.   prenatal multivitamin Tabs tablet Take 1 tablet by mouth daily at 12 noon.   valACYclovir 1000 MG tablet Commonly known as: VALTREX At the start of an outbreak take 1 tablet daily for 5 days.        Hessie Dibble, MD 05/30/2023 3:53 AM

## 2023-06-04 ENCOUNTER — Encounter: Payer: Self-pay | Admitting: Obstetrics & Gynecology

## 2023-06-07 ENCOUNTER — Encounter: Attending: Obstetrics and Gynecology | Admitting: Dietician

## 2023-06-07 DIAGNOSIS — O24419 Gestational diabetes mellitus in pregnancy, unspecified control: Secondary | ICD-10-CM | POA: Diagnosis not present

## 2023-06-07 NOTE — Progress Notes (Signed)
 Patient was seen on 06/07/2023 for Gestational Diabetes self-management class at the [facility]. The following learning objectives were met by the patient during this course:  States the definition of Gestational Diabetes States why dietary management is important in controlling blood glucose Describes the effects each nutrient has on blood glucose levels Demonstrates ability to create a balanced meal plan Demonstrates carbohydrate counting  States when to check blood glucose levels Demonstrates proper blood glucose monitoring techniques States the effect of stress and exercise on blood glucose levels States the importance of limiting caffeine and abstaining from alcohol and smoking  Blood glucose monitor: Pt present today with accu chek and supplies Fasting none reported 2 hour post prandial: none reported Blood glucose reading: 82 mg/dL, reported as pre meal per Pt  Lab Results  Component Value Date   HGBA1C 5.1 02/01/2021  OGTT: obtained 05/24/2022 Fasting 83, 1 hour 192*, 2 hour 158* mg/dL per chart   Patient instructed to monitor glucose levels: QID FBS: 60 - <90 1 hour: <140 2 hour: <120  *Patient received handouts: Nutrition Diabetes and Pregnancy Carbohydrate Counting List Blood glucose log Snack ideas for diabetes during pregnancy  Patient will be seen for follow-up as needed.

## 2023-06-08 ENCOUNTER — Encounter: Payer: Medicaid Other | Admitting: Family Medicine

## 2023-06-16 ENCOUNTER — Ambulatory Visit: Admitting: Obstetrics & Gynecology

## 2023-06-16 VITALS — BP 114/64 | HR 106 | Wt 158.0 lb

## 2023-06-16 DIAGNOSIS — O34219 Maternal care for unspecified type scar from previous cesarean delivery: Secondary | ICD-10-CM

## 2023-06-16 DIAGNOSIS — O24419 Gestational diabetes mellitus in pregnancy, unspecified control: Secondary | ICD-10-CM

## 2023-06-16 DIAGNOSIS — Z148 Genetic carrier of other disease: Secondary | ICD-10-CM

## 2023-06-16 DIAGNOSIS — O0990 Supervision of high risk pregnancy, unspecified, unspecified trimester: Secondary | ICD-10-CM | POA: Diagnosis not present

## 2023-06-16 NOTE — Progress Notes (Signed)
   PRENATAL VISIT NOTE  Subjective:  Priscilla Phillips is a 28 y.o. G3P1011 at [redacted]w[redacted]d being seen today for ongoing prenatal care.  She is currently monitored for the following issues for this high-risk pregnancy and has Previous cesarean delivery affecting pregnancy, antepartum; Supervision of high risk pregnancy, antepartum; and Carrier of spinal muscular atrophy on their problem list.  Patient reports no complaints.  Contractions: Not present. Vag. Bleeding: None.  Movement: Present. Denies leaking of fluid.   The following portions of the patient's history were reviewed and updated as appropriate: allergies, current medications, past family history, past medical history, past social history, past surgical history and problem list.   Objective:   Vitals:   06/16/23 1118  BP: 114/64  Pulse: (!) 106  Weight: 158 lb (71.7 kg)    Fetal Status:     Movement: Present     General:  Alert, oriented and cooperative. Patient is in no acute distress.  Skin: Skin is warm and dry. No rash noted.   Cardiovascular: Normal heart rate noted  Respiratory: Normal respiratory effort, no problems with respiration noted  Abdomen: Soft, gravid, appropriate for gestational age.  Pain/Pressure: Absent     Pelvic: Cervical exam deferred        Extremities: Normal range of motion.  Edema: None  Mental Status: Normal mood and affect. Normal behavior. Normal judgment and thought content.   Assessment and Plan:  Pregnancy: G3P1011 at [redacted]w[redacted]d 1. Previous cesarean delivery affecting pregnancy, antepartum (Primary) 39 week repeat  2. Carrier of spinal muscular atrophy   3. Supervision of high risk pregnancy, antepartum GDM diet controlled, FBS wnl most PP in range, continue present mgmt. Needs growth [place]  Preterm labor symptoms and general obstetric precautions including but not limited to vaginal bleeding, contractions, leaking of fluid and fetal movement were reviewed in detail with the patient. Please  refer to After Visit Summary for other counseling recommendations.   Return in about 2 weeks (around 06/30/2023).  Future Appointments  Date Time Provider Department Center  07/03/2023  2:50 PM Shea Evans Gillian Scarce, MD [facility] None  07/18/2023  1:30 PM Gerrit Heck, CNM [facility] None  08/03/2023  2:10 PM Levie Heritage, DO [facility] None  08/10/2023  2:10 PM Shea Evans Gillian Scarce, MD [facility] None  08/17/2023  2:10 PM Levie Heritage, DO [facility] None  09/25/2023  2:00 PM Salvatore Decent, FNP [facility]    Scheryl Darter, MD

## 2023-06-29 ENCOUNTER — Encounter: Payer: Self-pay | Admitting: Obstetrics and Gynecology

## 2023-06-29 DIAGNOSIS — O24410 Gestational diabetes mellitus in pregnancy, diet controlled: Secondary | ICD-10-CM | POA: Insufficient documentation

## 2023-07-03 ENCOUNTER — Ambulatory Visit (INDEPENDENT_AMBULATORY_CARE_PROVIDER_SITE_OTHER): Payer: Medicaid Other | Admitting: Obstetrics and Gynecology

## 2023-07-03 VITALS — BP 113/66 | HR 105 | Wt 159.0 lb

## 2023-07-03 DIAGNOSIS — O0990 Supervision of high risk pregnancy, unspecified, unspecified trimester: Secondary | ICD-10-CM

## 2023-07-03 DIAGNOSIS — Z3009 Encounter for other general counseling and advice on contraception: Secondary | ICD-10-CM

## 2023-07-03 DIAGNOSIS — O24419 Gestational diabetes mellitus in pregnancy, unspecified control: Secondary | ICD-10-CM

## 2023-07-03 DIAGNOSIS — O34219 Maternal care for unspecified type scar from previous cesarean delivery: Secondary | ICD-10-CM | POA: Diagnosis not present

## 2023-07-03 DIAGNOSIS — Z3A33 33 weeks gestation of pregnancy: Secondary | ICD-10-CM | POA: Diagnosis not present

## 2023-07-03 NOTE — Progress Notes (Signed)
   PRENATAL VISIT NOTE  Subjective:  Priscilla Phillips is a 28 y.o. G3P1011 at [redacted]w[redacted]d being seen today for ongoing prenatal care.  She is currently monitored for the following issues for this high-risk pregnancy and has Previous cesarean delivery affecting pregnancy, antepartum; Supervision of high risk pregnancy, antepartum; Carrier of spinal muscular atrophy; and GDM, class A1 on their problem list.  Patient reports generalized pelvic pressure/discomfort without regular contractions.  Contractions: Irritability. Vag. Bleeding: None.  Movement: Present. Denies leaking of fluid.   Patient requests repeat cesarean, declines tubal, interested in depo while in the hospital  Has GDMA1, not checking BS x 2 weeks. Previously was only checking approximately 1 x per day. Reports fastings 90-95 and postprandial 1 hours around 130 at the highest.  The following portions of the patient's history were reviewed and updated as appropriate: allergies, current medications, past family history, past medical history, past social history, past surgical history and problem list.   Objective:   Vitals:   07/03/23 1436  BP: 113/66  Pulse: (!) 105  Weight: 159 lb (72.1 kg)  Body mass index is 33.23 kg/m.  Total weight gain: 25 lb (11.3 kg)   Fetal Status: Fetal Heart Rate (bpm): 143 Fundal Height: 33 cm Movement: Present     General:  Alert, oriented and cooperative. Patient is in no acute distress.  Skin: Skin is warm and dry. No rash noted.   Cardiovascular: Normal heart rate noted  Respiratory: Normal respiratory effort, no problems with respiration noted  Abdomen: Soft, gravid, appropriate for gestational age.  Pain/Pressure: Present     Pelvic: Cervical exam deferred        Extremities: Normal range of motion.  Edema: None  Mental Status: Normal mood and affect. Normal behavior. Normal judgment and thought content.   Assessment and Plan:  Pregnancy: G3P1011 at [redacted]w[redacted]d 1. Supervision of high risk  pregnancy, antepartum (Primary) Routine care [facility] info in AVS - Ambulatory Referral For Surgery Scheduling  2. Gestational diabetes mellitus (GDM) affecting pregnancy, antepartum Encouraged better compliance with checking glucose Log sheets given and reviewed goals Advised mychart message with glucose log in 1 week Growth sono scheduled 4/28  3. History of cesarean delivery, currently pregnant Declines TOLAC, requests repeat, not yet scheduled. Request sent for 5/14 Declines tubal - Ambulatory Referral For Surgery Scheduling  4. Contraceptive education Counseled on options in detail. Patient requests depo while in hospital  5. [redacted] weeks gestation of pregnancy    Preterm labor symptoms and general obstetric precautions including but not limited to vaginal bleeding, contractions, leaking of fluid and fetal movement were reviewed in detail with the patient. Please refer to After Visit Summary for other counseling recommendations.   Return in about 2 weeks (around 07/17/2023).  Future Appointments  Date Time Provider Department Center  07/18/2023  1:30 PM Gerrit Heck, CNM [facility] None  07/24/2023  1:00 PM [facility] PROVIDER 1 [facility]  07/24/2023  1:30 PM [facility] US2 [facility]  08/03/2023  2:10 PM Levie Heritage, DO [facility] None  08/10/2023  2:10 PM Shea Evans Gillian Scarce, MD [facility] None  08/17/2023  2:10 PM Levie Heritage, DO [facility] None  09/25/2023  2:00 PM Salvatore Decent, FNP [facility]    Wanita Chamberlain, MD

## 2023-07-18 ENCOUNTER — Ambulatory Visit (INDEPENDENT_AMBULATORY_CARE_PROVIDER_SITE_OTHER): Payer: Medicaid Other

## 2023-07-18 ENCOUNTER — Other Ambulatory Visit (HOSPITAL_COMMUNITY)
Admission: RE | Admit: 2023-07-18 | Discharge: 2023-07-18 | Disposition: A | Discharge status: Home / Self Care | Source: Ambulatory Visit

## 2023-07-18 VITALS — BP 123/86 | HR 110 | Wt 158.0 lb

## 2023-07-18 DIAGNOSIS — Z3A35 35 weeks gestation of pregnancy: Secondary | ICD-10-CM

## 2023-07-18 DIAGNOSIS — O0990 Supervision of high risk pregnancy, unspecified, unspecified trimester: Secondary | ICD-10-CM | POA: Insufficient documentation

## 2023-07-18 DIAGNOSIS — O24419 Gestational diabetes mellitus in pregnancy, unspecified control: Secondary | ICD-10-CM | POA: Diagnosis not present

## 2023-07-18 LAB — GLUCOSE, POCT (MANUAL RESULT ENTRY): POC Glucose: 80 mg/dL (ref 70–99)

## 2023-07-18 NOTE — Progress Notes (Signed)
   HIGH-RISK PREGNANCY OFFICE VISIT  Patient name: Priscilla Phillips MRN [id number]  Date of birth: [birth year] Chief Complaint:   Routine Prenatal Visit  Subjective:   LORETTE PETERKIN is a 28 y.o. G21P1011 female at [redacted]w[redacted]d with an Estimated Date of Delivery: 08/16/23 being seen today for ongoing management of a high-risk pregnancy aeb has Previous cesarean delivery affecting pregnancy, antepartum; Supervision of high risk pregnancy, antepartum; Carrier of spinal muscular atrophy; and GDM, class A1 on their problem list.  Patient presents today, with partner and toddler son, with occasional contractions.  Patient states she has been continuously, but infrequently having contractions, but feels this is normal. Patient endorses fetal movement.   Patient denies vaginal concerns including abnormal discharge, leaking of fluid, and bleeding. No issues with urination, constipation, or diarrhea.    Contractions: Irregular. Vag. Bleeding: None.  Movement: Present.  Reviewed past medical,surgical, social, obstetrical and family history as well as problem list, medications and allergies.  Objective   Vitals:   07/18/23 1351  BP: 123/86  Pulse: (!) 110  Weight: 158 lb (71.7 kg)  Body mass index is 33.02 kg/m.  Total Weight Gain:24 lb (10.9 kg)         Physical Examination:   General appearance: Well appearing, and in no distress  Mental status: Alert, oriented to person, place, and time  Skin: Warm & dry  Cardiovascular: Normal heart rate noted  Respiratory: Normal respiratory effort, no distress  Abdomen: Soft, gravid, nontender, AGA with Fundal Height: 36 cm  Pelvic: Cervical exam deferred           Extremities: Edema: None  Fetal Status: Fetal Heart Rate (bpm): 147  Movement: Present   Results for orders placed or performed in visit on 07/18/23 (from the past 24 hours)  POCT Glucose (CBG)   Collection Time: 07/18/23  4:40 PM  Result Value Ref Range   POC Glucose 80 70 - 99 mg/dl     Assessment & Plan:  High-risk pregnancy of a 28 y.o., G3P1011 at [redacted]w[redacted]d with an Estimated Date of Delivery: 08/16/23   1. Supervision of high risk pregnancy, antepartum -Anticipatory guidance for upcoming appts. -Patient to schedule next appt in 1 weeks for an in-person visit.  2. [redacted] weeks gestation of pregnancy -Doing well. -Discussed scheduling planned repeat c/s. -Patient desires to go after toddlers birthday (May 15th). -Message sent for May 16th scheduling.   3. Gestational diabetes mellitus (GDM), antepartum, gestational diabetes method of control unspecified -Not checking sugars regularly -Reports last values on April 13th : Fasting 81, Lunch 98, Dinner 117 -Random today was 80.      Meds: No orders of the defined types were placed in this encounter.  Labs/procedures today:  Lab Orders         Strep Gp B NAA         POCT Glucose (CBG)       Reviewed: Preterm labor symptoms and general obstetric precautions including but not limited to vaginal bleeding, contractions, leaking of fluid and fetal movement were reviewed in detail with the patient.  All questions were answered.  Follow-up: Return in about 1 week (around 07/25/2023).  Orders Placed This Encounter  Procedures   Strep Gp B NAA   POCT Glucose (CBG)   Kraig Peru MSN, CNM 07/18/2023

## 2023-07-20 ENCOUNTER — Other Ambulatory Visit: Payer: Self-pay | Admitting: Obstetrics and Gynecology

## 2023-07-20 DIAGNOSIS — O34219 Maternal care for unspecified type scar from previous cesarean delivery: Secondary | ICD-10-CM

## 2023-07-20 DIAGNOSIS — O0990 Supervision of high risk pregnancy, unspecified, unspecified trimester: Secondary | ICD-10-CM

## 2023-07-20 LAB — CERVICOVAGINAL ANCILLARY ONLY
Chlamydia: NEGATIVE
Comment: NEGATIVE
Comment: NORMAL
Neisseria Gonorrhea: NEGATIVE

## 2023-07-20 LAB — STREP GP B NAA: Strep Gp B NAA: NEGATIVE

## 2023-07-21 ENCOUNTER — Encounter (HOSPITAL_COMMUNITY): Payer: Self-pay

## 2023-07-21 NOTE — Patient Instructions (Signed)
 IDALIA ALLBRITTON  07/21/2023   Your procedure is scheduled on:  5.16.2025  Arrive at 0745 at Entrance C on [address] at [facility]  and [employer]. You are invited to use the FREE valet parking or use the Visitor's parking deck.  Pick up the phone at the desk and dial [phone].  Call this number if you have problems the morning of surgery: [phone]  Remember:   Do not eat food:(After Midnight) Desps de medianoche.  You may drink clear liquids until arrival at __0745___.  Clear liquids means a liquid you can see thru.  It can have color such as Cola or Kool aid.  Tea is OK and coffee as long as no milk or creamer of any kind.  Take these medicines the morning of surgery with A SIP OF WATER :  none   Do not wear jewelry, make-up or nail polish.  Do not wear lotions, powders, or perfumes. Do not wear deodorant.  Do not shave 48 hours prior to surgery.  Do not bring valuables to the hospital.  [facility] is not   responsible for any belongings or valuables brought to the hospital.  Contacts, dentures or bridgework may not be worn into surgery.  Leave suitcase in the car. After surgery it may be brought to your room.  For patients admitted to the hospital, checkout time is 11:00 AM the day of              discharge.      Please read over the following fact sheets that you were given:     Preparing for Surgery

## 2023-07-24 ENCOUNTER — Ambulatory Visit: Attending: Obstetrics & Gynecology

## 2023-07-24 ENCOUNTER — Ambulatory Visit (HOSPITAL_BASED_OUTPATIENT_CLINIC_OR_DEPARTMENT_OTHER): Admitting: Maternal & Fetal Medicine

## 2023-07-24 VITALS — BP 115/60 | HR 108

## 2023-07-24 DIAGNOSIS — Z3A36 36 weeks gestation of pregnancy: Secondary | ICD-10-CM | POA: Diagnosis not present

## 2023-07-24 DIAGNOSIS — O24419 Gestational diabetes mellitus in pregnancy, unspecified control: Secondary | ICD-10-CM | POA: Insufficient documentation

## 2023-07-24 DIAGNOSIS — O0990 Supervision of high risk pregnancy, unspecified, unspecified trimester: Secondary | ICD-10-CM | POA: Insufficient documentation

## 2023-07-24 DIAGNOSIS — O24410 Gestational diabetes mellitus in pregnancy, diet controlled: Secondary | ICD-10-CM | POA: Insufficient documentation

## 2023-07-24 NOTE — Progress Notes (Signed)
   Patient information  Patient Name: Priscilla Phillips  Patient MRN:   [id number]  Referring practice: [facility] Referring Provider: [facility] ([facility])  MFM CONSULT  Priscilla Phillips is a 28 y.o. G3P1011 at [redacted]w[redacted]d here for ultrasound and consultation. 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   Previous cesarean delivery affecting pregnancy, antepartum 01/20/2023   Supervision of high risk pregnancy, antepartum 01/20/2023   [name] is doing well today with no acute concerns.  The patient is more consistently checked her blood sugars and the majority are at goal today.  We discussed the timing of delivery is likely around [redacted] weeks gestation given the normal fetal weight and glucose values.  Since her blood sugars are normal there are no other indications to warrant antenatal testing.  I encouraged her to follow-up with her doctor weekly until delivery  Sonographic findings Single intrauterine pregnancy at 36w 5d.  Fetal cardiac activity:  Observed and appears normal. Presentation: Cephalic. Interval fetal anatomy appears normal. Fetal biometry shows the estimated fetal weight at the 71 percentile. Amniotic fluid volume: Within normal limits. MVP: 5.6 cm. Placenta: Posterior Fundal.  There are limitations of prenatal ultrasound such as the inability to detect certain abnormalities due to poor visualization. Various factors such as fetal position, gestational age and maternal body habitus may increase the difficulty in visualizing the fetal anatomy.    Recommendations - Delivery around 39 to [redacted] weeks gestation  Review of Systems: A review of systems was performed and was negative except per HPI   Vitals and Physical Exam    07/24/2023   12:54 PM 07/21/2023    4:09 PM 07/18/2023    1:51 PM  Vitals with BMI  Height  4\' 10"    Weight  158 lbs 158 lbs  BMI  33.03   Systolic 115  123   Diastolic 60  86  Pulse 108  110  Sitting comfortably on the sonogram table Nonlabored breathing Normal rate and rhythm Abdomen is nontender  Past pregnancies OB History  Gravida Para Term Preterm AB Living  3 1 1  1 1   SAB IAB Ectopic Multiple Live Births  1   0 1    # Outcome Date GA Lbr Len/2nd Weight Sex Type Anes PTL Lv  3 Current           2 Term 08/09/21 [redacted]w[redacted]d  6 lb 1 oz (2.75 kg) M CS-LTranv EPI  LIV  1 SAB 05/2018 [redacted]w[redacted]d            I spent 20 minutes reviewing the patients chart, including labs and images as well as counseling the patient about her medical conditions. Greater than 50% of the time was spent in direct face-to-face patient counseling.  Penney Bowling  MFM, [facility]   07/24/2023  3:49 PM

## 2023-07-26 ENCOUNTER — Other Ambulatory Visit: Payer: Self-pay | Admitting: Obstetrics and Gynecology

## 2023-08-03 ENCOUNTER — Telehealth: Payer: Self-pay | Admitting: Family Medicine

## 2023-08-03 ENCOUNTER — Telehealth: Admitting: Family Medicine

## 2023-08-03 DIAGNOSIS — O0990 Supervision of high risk pregnancy, unspecified, unspecified trimester: Secondary | ICD-10-CM

## 2023-08-03 DIAGNOSIS — O34219 Maternal care for unspecified type scar from previous cesarean delivery: Secondary | ICD-10-CM | POA: Diagnosis not present

## 2023-08-03 DIAGNOSIS — Z3A39 39 weeks gestation of pregnancy: Secondary | ICD-10-CM | POA: Diagnosis not present

## 2023-08-03 DIAGNOSIS — O24410 Gestational diabetes mellitus in pregnancy, diet controlled: Secondary | ICD-10-CM

## 2023-08-03 NOTE — Progress Notes (Signed)
 OBSTETRICS PRENATAL VIRTUAL VISIT ENCOUNTER NOTE  Provider location: Center for [facility]   Patient location: Home  I connected with Bryla V Manes on 08/03/23 at  2:10 PM EDT by MyChart Video Encounter and verified that I am speaking with the correct person using two identifiers. I discussed the limitations, risks, security and privacy concerns of performing an evaluation and management service virtually and the availability of in person appointments. I also discussed with the patient that there may be a patient responsible charge related to this service. The patient expressed understanding and agreed to proceed. Subjective:  Priscilla Phillips is a 28 y.o. G3P1011 at [redacted]w[redacted]d being seen today for ongoing prenatal care.  She is currently monitored for the following issues for this high-risk pregnancy and has Previous cesarean delivery affecting pregnancy, antepartum; Supervision of high risk pregnancy, antepartum; Carrier of spinal muscular atrophy; and GDM, class A1 on their problem list.  Patient reports no complaints.   .  .   . Denies any leaking of fluid.   The following portions of the patient's history were reviewed and updated as appropriate: allergies, current medications, past family history, past medical history, past social history, past surgical history and problem list.   Objective:  There were no vitals filed for this visit.  Fetal Status:           General:  Alert, oriented and cooperative. Patient is in no acute distress.  Respiratory: Normal respiratory effort, no problems with respiration noted  Mental Status: Normal mood and affect. Normal behavior. Normal judgment and thought content.  Rest of physical exam deferred due to type of encounter  Imaging: US  MFM OB FOLLOW UP Result Date: 07/24/2023 ----------------------------------------------------------------------  OBSTETRICS REPORT                       (Signed Final 07/24/2023 03:50  pm) ---------------------------------------------------------------------- Patient Info  ID #:       [id number]                          D.O.B.:  1995/07/01 (27 yrs)(F)  Name:       Priscilla Phillips               Visit Date: 07/24/2023 01:05 pm ---------------------------------------------------------------------- Performed By  Attending:        Penney Bowling DO       Ref. Address:     [facility]  Performed By:     Fredrick Jenkins          Secondary Phy.:   [facility] MAU/Triage                    RDMS  Referred By:      Kathaleen Pale CHUBB          [facility] ---------------------------------------------------------------------- Orders  #  Description                           Code        Ordered By  1  US  MFM OB FOLLOW UP                   [id number]    Onnie Bilis ----------------------------------------------------------------------  #  Order #                     Accession #                Episode #  1  [id number]                   [id number]                 [id number] ---------------------------------------------------------------------- Indications  Gestational diabetes in pregnancy,             O24.419  unspecified control  [redacted] weeks gestation of pregnancy                Z3A.36  Encounter for other antenatal screening        Z36.2  follow-up ---------------------------------------------------------------------- Fetal Evaluation  Num Of Fetuses:         1  Fetal Heart Rate(bpm):  154  Cardiac Activity:       Observed  Presentation:           Cephalic  Placenta:               Posterior Fundal  P. Cord Insertion:      Previously seen  Amniotic Fluid  AFI FV:      Within normal limits  AFI Sum(cm)     %Tile       Largest Pocket(cm)  18.05           68          5.6  RUQ(cm)       RLQ(cm)       LUQ(cm)         LLQ(cm)  5.6           3.28          5.32           3.85 ---------------------------------------------------------------------- Biometry  BPD:      88.7  mm     G. Age:  35w 6d         39  %    CI:        82.48   %    70 - 86                                                          FL/HC:      23.0   %    20.8 - 22.6  HC:  308.1  mm     G. Age:  34w 3d        < 1  %    HC/AC:      0.89        0.92 - 1.05  AC:      347.7  mm     G. Age:  38w 5d         96  %    FL/BPD:     79.9   %    71 - 87  FL:       70.9  mm     G. Age:  36w 2d         38  %    FL/AC:      20.4   %    20 - 24  Est. FW:    3171  gm           7 lb     71  % ---------------------------------------------------------------------- OB History  Blood Type:   A+  Maternal Racial/Ethnic Group:   Black (non-Hispanic)  Gravidity:    3         Term:   1        Prem:   0        SAB:   1  TOP:          0       Ectopic:  0        Living: 1 ---------------------------------------------------------------------- Gestational Age  LMP:           36w 5d        Date:  11/09/22                  EDD:   08/16/23  U/S Today:     36w 2d                                        EDD:   08/19/23  Best:          36w 5d     Det. By:  LMP  (11/09/22)          EDD:   08/16/23 ---------------------------------------------------------------------- Anatomy  Diaphragm:             Appears normal         Kidneys:                Appear normal  Stomach:               Appears normal, left   Bladder:                Appears normal                         sided ---------------------------------------------------------------------- Cervix Uterus Adnexa  Cervix  Not visualized (advanced GA >24wks) ---------------------------------------------------------------------- Comments  MFM Consult Note  Athelene V Deloney is doing well today with no acute  concerns.  The patient is more consistently checked her blood sugars  and the majority are at goal today.  We discussed the timing  of delivery is  likely around [redacted] weeks gestation given the  normal fetal weight and glucose values.  Since her blood  sugars are normal there are no other indications to warrant  antenatal testing.  I encouraged her to follow-up with her  doctor weekly until delivery  Sonographic findings  Single intrauterine pregnancy at 36w 5d.  Fetal cardiac activity:  Observed and appears normal.  Presentation: Cephalic.  Interval fetal anatomy appears normal.  Fetal biometry shows the estimated fetal weight at the 71  percentile.  Amniotic fluid volume: Within normal limits. MVP: 5.6 cm.  Placenta: Posterior Fundal.  There are limitations of prenatal ultrasound such as the  inability to detect certain abnormalities due to poor  visualization. Various factors such as fetal position,  gestational age and maternal body habitus may increase the  difficulty in visualizing the fetal anatomy.  Recommendations  - Delivery around 39 to [redacted] weeks gestation ----------------------------------------------------------------------                  Penney Bowling, DO Electronically Signed Final Report   07/24/2023 03:50 pm ----------------------------------------------------------------------    Assessment and Plan:  Pregnancy: G3P1011 at [redacted]w[redacted]d 1. Supervision of high risk pregnancy, antepartum (Primary) Good fetal movement  2. GDM, class A1 controlled  3. Previous cesarean delivery affecting pregnancy, antepartum Scheduled for repeat next week  Term labor symptoms and general obstetric precautions including but not limited to vaginal bleeding, contractions, leaking of fluid and fetal movement were reviewed in detail with the patient. I discussed the assessment and treatment plan with the patient. The patient was provided an opportunity to ask questions and all were answered. The patient agreed with the plan and demonstrated an understanding of the instructions. The patient was advised to call back or seek an in-person office evaluation/go to MAU  at [facility] for any urgent or concerning symptoms. Please refer to After Visit Summary for other counseling recommendations.   I provided 8 minutes of face-to-face time during this encounter.  No follow-ups on file.  Future Appointments  Date Time Provider Department Center  08/09/2023  1:00 PM [facility] PAT 1 [facility] None  08/17/2023  1:00 PM [facility] NURSE [facility] None  09/21/2023  3:10 PM Vivek Grealish J, DO [facility] None  09/25/2023  2:00 PM Gavin Kast, FNP [facility]    Shamiya Demeritt J Parv Manthey, DO Center for [employer], [facility]

## 2023-08-03 NOTE — Telephone Encounter (Signed)
 Patient called [facility] saying that her midwife told her to call us  about getting a pack-n-play. After speaking with the Navigators they said that they would not be able to help this patient because our pack-n-plays are only for patients that come to this office and to tell the patient to ask the front desk in [place] to connect her with a navigator over there. Patient then asked does the [place] office have the same store that we do and I let the patient know that the food market and the pack -n-plays are two different things and what she is looking for Is not given at the food market.

## 2023-08-07 NOTE — Anesthesia Preprocedure Evaluation (Signed)
 Anesthesia Evaluation  Patient identified by MRN, date of birth, ID band Patient awake    Reviewed: Allergy & Precautions, NPO status , Patient's Chart, lab work & pertinent test results  History of Anesthesia Complications Negative for: history of anesthetic complications  Airway Mallampati: I       Dental no notable dental hx.    Pulmonary neg pulmonary ROS   Pulmonary exam normal        Cardiovascular negative cardio ROS Normal cardiovascular exam     Neuro/Psych   Anxiety        GI/Hepatic negative GI ROS, Neg liver ROS,,,  Endo/Other  diabetes, Gestational    Renal/GU negative Renal ROS     Musculoskeletal negative musculoskeletal ROS (+)    Abdominal   Peds  Hematology negative hematology ROS (+)   Anesthesia Other Findings Day of surgery medications reviewed with patient.  Reproductive/Obstetrics (+) Pregnancy (Hx of C/S x1)                             Anesthesia Physical Anesthesia Plan  ASA: 2  Anesthesia Plan: Spinal   Post-op Pain Management: Ofirmev  IV (intra-op)*   Induction:   PONV Risk Score and Plan: 4 or greater and Treatment may vary due to age or medical condition, Ondansetron  and Dexamethasone   Airway Management Planned: Natural Airway  Additional Equipment: None  Intra-op Plan:   Post-operative Plan:   Informed Consent: I have reviewed the patients History and Physical, chart, labs and discussed the procedure including the risks, benefits and alternatives for the proposed anesthesia with the patient or authorized representative who has indicated his/her understanding and acceptance.       Plan Discussed with: CRNA  Anesthesia Plan Comments: (Lab Results      Component                Value               Date                      WBC                      14.3 (H)            08/08/2023                HGB                      11.9 (L)             08/08/2023                HCT                      35.1 (L)            08/08/2023                MCV                      87.5                08/08/2023                PLT                      176  08/08/2023           )       Anesthesia Quick Evaluation

## 2023-08-08 ENCOUNTER — Other Ambulatory Visit: Payer: Self-pay

## 2023-08-08 ENCOUNTER — Inpatient Hospital Stay (HOSPITAL_COMMUNITY)
Admission: AD | Admit: 2023-08-08 | Discharge: 2023-08-10 | DRG: 787 | Disposition: A | Discharge status: Home / Self Care | Attending: Obstetrics and Gynecology | Admitting: Obstetrics and Gynecology

## 2023-08-08 ENCOUNTER — Encounter (HOSPITAL_COMMUNITY)
Admission: AD | Disposition: A | Discharge status: Home / Self Care | Payer: Self-pay | Source: Home / Self Care | Attending: Obstetrics and Gynecology

## 2023-08-08 ENCOUNTER — Encounter (HOSPITAL_COMMUNITY): Payer: Self-pay | Admitting: Obstetrics and Gynecology

## 2023-08-08 ENCOUNTER — Inpatient Hospital Stay (HOSPITAL_COMMUNITY): Payer: Self-pay | Admitting: Anesthesiology

## 2023-08-08 DIAGNOSIS — Z148 Genetic carrier of other disease: Secondary | ICD-10-CM

## 2023-08-08 DIAGNOSIS — Z3A38 38 weeks gestation of pregnancy: Secondary | ICD-10-CM

## 2023-08-08 DIAGNOSIS — O24410 Gestational diabetes mellitus in pregnancy, diet controlled: Secondary | ICD-10-CM | POA: Diagnosis present

## 2023-08-08 DIAGNOSIS — O34219 Maternal care for unspecified type scar from previous cesarean delivery: Secondary | ICD-10-CM

## 2023-08-08 DIAGNOSIS — O9081 Anemia of the puerperium: Secondary | ICD-10-CM | POA: Diagnosis not present

## 2023-08-08 DIAGNOSIS — Z98891 History of uterine scar from previous surgery: Secondary | ICD-10-CM

## 2023-08-08 DIAGNOSIS — O34211 Maternal care for low transverse scar from previous cesarean delivery: Secondary | ICD-10-CM | POA: Diagnosis not present

## 2023-08-08 DIAGNOSIS — Z8249 Family history of ischemic heart disease and other diseases of the circulatory system: Secondary | ICD-10-CM | POA: Diagnosis not present

## 2023-08-08 DIAGNOSIS — O24420 Gestational diabetes mellitus in childbirth, diet controlled: Secondary | ICD-10-CM | POA: Diagnosis present

## 2023-08-08 DIAGNOSIS — Z833 Family history of diabetes mellitus: Secondary | ICD-10-CM | POA: Diagnosis not present

## 2023-08-08 DIAGNOSIS — Z3A00 Weeks of gestation of pregnancy not specified: Secondary | ICD-10-CM | POA: Diagnosis not present

## 2023-08-08 DIAGNOSIS — O4202 Full-term premature rupture of membranes, onset of labor within 24 hours of rupture: Secondary | ICD-10-CM | POA: Diagnosis not present

## 2023-08-08 DIAGNOSIS — D62 Acute posthemorrhagic anemia: Secondary | ICD-10-CM | POA: Diagnosis not present

## 2023-08-08 DIAGNOSIS — O0990 Supervision of high risk pregnancy, unspecified, unspecified trimester: Principal | ICD-10-CM

## 2023-08-08 LAB — CBC
HCT: 35.1 % — ABNORMAL LOW (ref 36.0–46.0)
HCT: 33.3 % — ABNORMAL LOW (ref 36.0–46.0)
Hemoglobin: 11.9 g/dL — ABNORMAL LOW (ref 12.0–15.0)
Hemoglobin: 10.7 g/dL — ABNORMAL LOW (ref 12.0–15.0)
MCH: 29.7 pg (ref 26.0–34.0)
MCH: 28.6 pg (ref 26.0–34.0)
MCHC: 33.9 g/dL (ref 30.0–36.0)
MCHC: 32.1 g/dL (ref 30.0–36.0)
MCV: 87.5 fL (ref 80.0–100.0)
MCV: 89 fL (ref 80.0–100.0)
Platelets: 176 10*3/uL (ref 150–400)
Platelets: 194 10*3/uL (ref 150–400)
RBC: 4.01 MIL/uL (ref 3.87–5.11)
RBC: 3.74 MIL/uL — ABNORMAL LOW (ref 3.87–5.11)
RDW: 13.2 % (ref 11.5–15.5)
RDW: 13.1 % (ref 11.5–15.5)
WBC: 14.3 10*3/uL — ABNORMAL HIGH (ref 4.0–10.5)
WBC: 17.5 10*3/uL — ABNORMAL HIGH (ref 4.0–10.5)
nRBC: 0 % (ref 0.0–0.2)
nRBC: 0 % (ref 0.0–0.2)

## 2023-08-08 LAB — CREATININE, SERUM
Creatinine, Ser: 0.57 mg/dL (ref 0.44–1.00)
GFR, Estimated: >60 mL/min (ref >60)

## 2023-08-08 LAB — TYPE AND SCREEN
ABO/RH(D): A POS
Antibody Screen: NEGATIVE

## 2023-08-08 LAB — RPR: RPR Ser Ql: NONREACTIVE

## 2023-08-08 LAB — HIV ANTIBODY (ROUTINE TESTING W REFLEX): HIV Screen 4th Generation wRfx: NONREACTIVE

## 2023-08-08 LAB — GLUCOSE, CAPILLARY: Glucose-Capillary: 132 mg/dL — ABNORMAL HIGH (ref 70–99)

## 2023-08-08 SURGERY — Surgical Case
Anesthesia: Spinal | Site: Abdomen

## 2023-08-08 MED ORDER — SODIUM CHLORIDE 0.9% FLUSH: 3.0000 mL | INTRAVENOUS | Status: DC | PRN

## 2023-08-08 MED ORDER — SCOPOLAMINE 1 MG/3DAYS TD PT72
1.0000 | MEDICATED_PATCH | Freq: Once | TRANSDERMAL | Status: DC
  Administered 2023-08-08: 1.5 mg via TRANSDERMAL

## 2023-08-08 MED ORDER — DEXAMETHASONE SODIUM PHOSPHATE 10 MG/ML IJ SOLN
INTRAMUSCULAR | Status: DC | PRN
  Administered 2023-08-08: 4 mg via INTRAVENOUS

## 2023-08-08 MED ORDER — DIPHENHYDRAMINE HCL 25 MG PO CAPS: 25.0000 mg | ORAL_CAPSULE | Freq: Four times a day (QID) | ORAL | Status: DC | PRN

## 2023-08-08 MED ORDER — KETOROLAC TROMETHAMINE 30 MG/ML IJ SOLN
30.0000 mg | Freq: Four times a day (QID) | INTRAMUSCULAR | Status: AC
  Administered 2023-08-08 – 2023-08-09 (×2): 30 mg via INTRAVENOUS
  Filled 2023-08-08 (×2): qty 1

## 2023-08-08 MED ORDER — DEXMEDETOMIDINE HCL IN NACL 80 MCG/20ML IV SOLN
INTRAVENOUS | Status: AC
  Filled 2023-08-08: qty 20

## 2023-08-08 MED ORDER — ONDANSETRON HCL 4 MG/2ML IJ SOLN
INTRAMUSCULAR | Status: AC
  Filled 2023-08-08: qty 2

## 2023-08-08 MED ORDER — DIBUCAINE (PERIANAL) 1 % EX OINT: 1.0000 | TOPICAL_OINTMENT | CUTANEOUS | Status: DC | PRN

## 2023-08-08 MED ORDER — ENOXAPARIN SODIUM 40 MG/0.4ML IJ SOSY
40.0000 mg | PREFILLED_SYRINGE | INTRAMUSCULAR | Status: DC
  Administered 2023-08-08 – 2023-08-09 (×2): 40 mg via SUBCUTANEOUS
  Filled 2023-08-08 (×2): qty 0.4

## 2023-08-08 MED ORDER — SCOPOLAMINE 1 MG/3DAYS TD PT72
MEDICATED_PATCH | TRANSDERMAL | Status: AC
  Filled 2023-08-08: qty 1

## 2023-08-08 MED ORDER — NALOXONE HCL 0.4 MG/ML IJ SOLN: 0.4000 mg | INTRAMUSCULAR | Status: DC | PRN

## 2023-08-08 MED ORDER — MAGNESIUM HYDROXIDE 400 MG/5ML PO SUSP: 30.0000 mL | ORAL | Status: DC | PRN

## 2023-08-08 MED ORDER — ONDANSETRON HCL 4 MG/2ML IJ SOLN
INTRAMUSCULAR | Status: DC | PRN
  Administered 2023-08-08: 4 mg via INTRAVENOUS

## 2023-08-08 MED ORDER — MEPERIDINE HCL 25 MG/ML IJ SOLN: 6.2500 mg | INTRAMUSCULAR | Status: DC | PRN

## 2023-08-08 MED ORDER — LACTATED RINGERS IV SOLN: INTRAVENOUS | Status: AC

## 2023-08-08 MED ORDER — TETANUS-DIPHTH-ACELL PERTUSSIS 5-2.5-18.5 LF-MCG/0.5 IM SUSY: 0.5000 mL | PREFILLED_SYRINGE | Freq: Once | INTRAMUSCULAR | Status: DC

## 2023-08-08 MED ORDER — CEFAZOLIN SODIUM-DEXTROSE 2-4 GM/100ML-% IV SOLN: 2.0000 g | INTRAVENOUS | Status: DC

## 2023-08-08 MED ORDER — DIPHENHYDRAMINE HCL 50 MG/ML IJ SOLN: 12.5000 mg | INTRAMUSCULAR | Status: DC | PRN

## 2023-08-08 MED ORDER — FENTANYL CITRATE (PF) 100 MCG/2ML IJ SOLN
100.0000 ug | Freq: Once | INTRAMUSCULAR | Status: AC
  Administered 2023-08-08: 100 ug via INTRAVENOUS
  Filled 2023-08-08: qty 2

## 2023-08-08 MED ORDER — SODIUM CHLORIDE 0.9 % IR SOLN
Status: DC | PRN
  Administered 2023-08-08: 1000 mL

## 2023-08-08 MED ORDER — ONDANSETRON HCL 4 MG/2ML IJ SOLN: 4.0000 mg | Freq: Three times a day (TID) | INTRAMUSCULAR | Status: DC | PRN

## 2023-08-08 MED ORDER — SENNOSIDES-DOCUSATE SODIUM 8.6-50 MG PO TABS
2.0000 | ORAL_TABLET | Freq: Every day | ORAL | Status: DC
  Administered 2023-08-09 – 2023-08-10 (×2): 2 via ORAL
  Filled 2023-08-08 (×2): qty 2

## 2023-08-08 MED ORDER — SODIUM CHLORIDE 0.9% FLUSH
3.0000 mL | Freq: Two times a day (BID) | INTRAVENOUS | Status: DC
  Administered 2023-08-09: 3 mL via INTRAVENOUS
  Administered 2023-08-09: 5 mL via INTRAVENOUS

## 2023-08-08 MED ORDER — FENTANYL CITRATE (PF) 100 MCG/2ML IJ SOLN
INTRAMUSCULAR | Status: AC
  Filled 2023-08-08: qty 2

## 2023-08-08 MED ORDER — ACETAMINOPHEN 10 MG/ML IV SOLN
INTRAVENOUS | Status: DC | PRN
  Administered 2023-08-08: 1000 mg via INTRAVENOUS

## 2023-08-08 MED ORDER — MENTHOL 3 MG MT LOZG: 1.0000 | LOZENGE | OROMUCOSAL | Status: DC | PRN

## 2023-08-08 MED ORDER — SOD CITRATE-CITRIC ACID 500-334 MG/5ML PO SOLN: 30.0000 mL | ORAL | Status: DC

## 2023-08-08 MED ORDER — FENTANYL CITRATE (PF) 100 MCG/2ML IJ SOLN
INTRAMUSCULAR | Status: DC | PRN
  Administered 2023-08-08: .15 ug via INTRATHECAL

## 2023-08-08 MED ORDER — WITCH HAZEL-GLYCERIN EX PADS: 1.0000 | MEDICATED_PAD | CUTANEOUS | Status: DC | PRN

## 2023-08-08 MED ORDER — SOD CITRATE-CITRIC ACID 500-334 MG/5ML PO SOLN
30.0000 mL | Freq: Once | ORAL | Status: AC
  Administered 2023-08-08: 30 mL via ORAL

## 2023-08-08 MED ORDER — MORPHINE SULFATE (PF) 0.5 MG/ML IJ SOLN
INTRAMUSCULAR | Status: DC | PRN
  Administered 2023-08-08: 150 ug via INTRATHECAL

## 2023-08-08 MED ORDER — ACETAMINOPHEN 10 MG/ML IV SOLN: 1000.0000 mg | Freq: Once | INTRAVENOUS | Status: DC | PRN

## 2023-08-08 MED ORDER — SODIUM CHLORIDE 0.9 % IV SOLN
500.0000 mg | INTRAVENOUS | Status: AC
  Administered 2023-08-08: 500 mg via INTRAVENOUS

## 2023-08-08 MED ORDER — OXYTOCIN-SODIUM CHLORIDE 30-0.9 UT/500ML-% IV SOLN
INTRAVENOUS | Status: DC | PRN
  Administered 2023-08-08: 70 mL via INTRAVENOUS
  Administered 2023-08-08: 300 mL via INTRAVENOUS

## 2023-08-08 MED ORDER — OXYCODONE HCL 5 MG PO TABS
5.0000 mg | ORAL_TABLET | Freq: Four times a day (QID) | ORAL | Status: DC | PRN
  Administered 2023-08-10: 5 mg via ORAL
  Filled 2023-08-08: qty 1

## 2023-08-08 MED ORDER — PRENATAL MULTIVITAMIN CH
1.0000 | ORAL_TABLET | Freq: Every day | ORAL | Status: DC
  Administered 2023-08-08 – 2023-08-10 (×3): 1 via ORAL
  Filled 2023-08-08 (×3): qty 1

## 2023-08-08 MED ORDER — IBUPROFEN 600 MG PO TABS
600.0000 mg | ORAL_TABLET | Freq: Four times a day (QID) | ORAL | Status: DC
  Administered 2023-08-09 – 2023-08-10 (×6): 600 mg via ORAL
  Filled 2023-08-08 (×6): qty 1

## 2023-08-08 MED ORDER — NALOXONE HCL 4 MG/10ML IJ SOLN: 1.0000 ug/kg/h | INTRAVENOUS | Status: DC | PRN

## 2023-08-08 MED ORDER — KETOROLAC TROMETHAMINE 30 MG/ML IJ SOLN: 30.0000 mg | Freq: Four times a day (QID) | INTRAMUSCULAR | Status: DC | PRN

## 2023-08-08 MED ORDER — ACETAMINOPHEN 500 MG PO TABS
1000.0000 mg | ORAL_TABLET | Freq: Four times a day (QID) | ORAL | Status: DC
  Administered 2023-08-08 – 2023-08-10 (×7): 1000 mg via ORAL
  Filled 2023-08-08 (×8): qty 2

## 2023-08-08 MED ORDER — DROPERIDOL 2.5 MG/ML IJ SOLN: 0.6250 mg | Freq: Once | INTRAMUSCULAR | Status: DC | PRN

## 2023-08-08 MED ORDER — DIPHENHYDRAMINE HCL 25 MG PO CAPS: 25.0000 mg | ORAL_CAPSULE | ORAL | Status: DC | PRN

## 2023-08-08 MED ORDER — FENTANYL CITRATE (PF) 100 MCG/2ML IJ SOLN: 25.0000 ug | INTRAMUSCULAR | Status: DC | PRN

## 2023-08-08 MED ORDER — PHENYLEPHRINE HCL-NACL 20-0.9 MG/250ML-% IV SOLN
INTRAVENOUS | Status: DC | PRN
  Administered 2023-08-08: 60 ug/min via INTRAVENOUS

## 2023-08-08 MED ORDER — GABAPENTIN 100 MG PO CAPS
200.0000 mg | ORAL_CAPSULE | Freq: Every day | ORAL | Status: DC
  Administered 2023-08-09 (×2): 200 mg via ORAL
  Filled 2023-08-08 (×2): qty 2

## 2023-08-08 MED ORDER — KETOROLAC TROMETHAMINE 30 MG/ML IJ SOLN
30.0000 mg | Freq: Four times a day (QID) | INTRAMUSCULAR | Status: DC | PRN
  Administered 2023-08-08: 30 mg via INTRAVENOUS

## 2023-08-08 MED ORDER — SIMETHICONE 80 MG PO CHEW
80.0000 mg | CHEWABLE_TABLET | Freq: Three times a day (TID) | ORAL | Status: DC
  Administered 2023-08-08 – 2023-08-10 (×6): 80 mg via ORAL
  Filled 2023-08-08 (×6): qty 1

## 2023-08-08 MED ORDER — COCONUT OIL OIL: 1.0000 | TOPICAL_OIL | Status: DC | PRN

## 2023-08-08 MED ORDER — MEDROXYPROGESTERONE ACETATE 150 MG/ML IM SUSP: 150.0000 mg | INTRAMUSCULAR | Status: DC | PRN

## 2023-08-08 MED ORDER — CEFAZOLIN SODIUM-DEXTROSE 2-4 GM/100ML-% IV SOLN
2.0000 g | INTRAVENOUS | Status: AC
  Administered 2023-08-08: 2 g via INTRAVENOUS

## 2023-08-08 MED ORDER — LACTATED RINGERS IV BOLUS
1000.0000 mL | Freq: Once | INTRAVENOUS | Status: AC
  Administered 2023-08-08: 1000 mL via INTRAVENOUS

## 2023-08-08 MED ORDER — MEASLES, MUMPS & RUBELLA VAC IJ SOLR: 0.5000 mL | Freq: Once | INTRAMUSCULAR | Status: DC

## 2023-08-08 MED ORDER — FAMOTIDINE IN NACL 20-0.9 MG/50ML-% IV SOLN: 20.0000 mg | Freq: Once | INTRAVENOUS | Status: DC

## 2023-08-08 MED ORDER — POVIDONE-IODINE 10 % EX SWAB: 2.0000 | Freq: Once | CUTANEOUS | Status: DC

## 2023-08-08 MED ORDER — KETOROLAC TROMETHAMINE 30 MG/ML IJ SOLN
INTRAMUSCULAR | Status: AC
  Filled 2023-08-08: qty 1

## 2023-08-08 MED ORDER — SIMETHICONE 80 MG PO CHEW: 80.0000 mg | CHEWABLE_TABLET | ORAL | Status: DC | PRN

## 2023-08-08 MED ORDER — MORPHINE SULFATE (PF) 0.5 MG/ML IJ SOLN
INTRAMUSCULAR | Status: AC
Start: 2023-08-08 — End: ?
  Filled 2023-08-08: qty 10

## 2023-08-08 MED ORDER — DEXMEDETOMIDINE HCL IN NACL 80 MCG/20ML IV SOLN
INTRAVENOUS | Status: DC | PRN
Start: 2023-08-08 — End: 2023-08-08
  Administered 2023-08-08 (×2): 4 ug via INTRAVENOUS

## 2023-08-08 MED ORDER — STERILE WATER FOR IRRIGATION IR SOLN
Status: DC | PRN
  Administered 2023-08-08: 1000 mL

## 2023-08-08 MED ORDER — BUPIVACAINE IN DEXTROSE 0.75-8.25 % IT SOLN
INTRATHECAL | Status: DC | PRN
  Administered 2023-08-08: 1.6 mL via INTRATHECAL

## 2023-08-08 MED ORDER — PHENYLEPHRINE 80 MCG/ML (10ML) SYRINGE FOR IV PUSH (FOR BLOOD PRESSURE SUPPORT): PREFILLED_SYRINGE | INTRAVENOUS | Status: DC | PRN

## 2023-08-08 MED ORDER — OXYTOCIN-SODIUM CHLORIDE 30-0.9 UT/500ML-% IV SOLN: 2.5000 [IU]/h | INTRAVENOUS | Status: AC

## 2023-08-08 MED ORDER — DEXAMETHASONE SODIUM PHOSPHATE 4 MG/ML IJ SOLN
INTRAMUSCULAR | Status: AC
Start: 2023-08-08 — End: ?
  Filled 2023-08-08: qty 1

## 2023-08-08 MED ORDER — TRANEXAMIC ACID-NACL 1000-0.7 MG/100ML-% IV SOLN
1000.0000 mg | Freq: Once | INTRAVENOUS | Status: AC
  Administered 2023-08-08: 1000 mg via INTRAVENOUS

## 2023-08-08 SURGICAL SUPPLY — 31 items
BENZOIN TINCTURE PRP APPL 2/3 (GAUZE/BANDAGES/DRESSINGS) IMPLANT
CHLORAPREP W/TINT 26 (MISCELLANEOUS) ×2 IMPLANT
CLAMP UMBILICAL CORD (MISCELLANEOUS) ×1 IMPLANT
CLOTH BEACON ORANGE TIMEOUT ST (SAFETY) ×1 IMPLANT
DRSG OPSITE POSTOP 4X10 (GAUZE/BANDAGES/DRESSINGS) ×1 IMPLANT
ELECTRODE REM PT RTRN 9FT ADLT (ELECTROSURGICAL) ×1 IMPLANT
EXTRACTOR VACUUM KIWI (MISCELLANEOUS) IMPLANT
GAUZE SPONGE 4X4 12PLY STRL LF (GAUZE/BANDAGES/DRESSINGS) IMPLANT
GLOVE SURG ORTHO 8.0 STRL STRW (GLOVE) ×1 IMPLANT
GOWN STRL REUS W/TWL LRG LVL3 (GOWN DISPOSABLE) ×2 IMPLANT
HEMOSTAT ARISTA ABSORB 3G PWDR (HEMOSTASIS) IMPLANT
KIT ABG SYR 3ML LUER SLIP (SYRINGE) IMPLANT
NDL HYPO 25X5/8 SAFETYGLIDE (NEEDLE) IMPLANT
NEEDLE HYPO 25X5/8 SAFETYGLIDE (NEEDLE) IMPLANT
NS IRRIG 1000ML POUR BTL (IV SOLUTION) ×1 IMPLANT
PACK C SECTION WH (CUSTOM PROCEDURE TRAY) ×1 IMPLANT
PAD ABD 8X10 STRL (GAUZE/BANDAGES/DRESSINGS) IMPLANT
PAD OB MATERNITY 4.3X12.25 (PERSONAL CARE ITEMS) ×1 IMPLANT
RTRCTR C-SECT PINK 25CM LRG (MISCELLANEOUS) IMPLANT
STRIP CLOSURE SKIN 1/2X4 (GAUZE/BANDAGES/DRESSINGS) IMPLANT
SUT MNCRL 0 VIOLET CTX 36 (SUTURE) IMPLANT
SUT MON AB-0 CT1 36 (SUTURE) ×2 IMPLANT
SUT PLAIN 0 NONE (SUTURE) IMPLANT
SUT VIC AB 0 CT1 27XBRD ANBCTR (SUTURE) ×2 IMPLANT
SUT VIC AB 2-0 CT1 TAPERPNT 27 (SUTURE) ×1 IMPLANT
SUT VIC AB 4-0 SH 27XANBCTRL (SUTURE) ×1 IMPLANT
SUTURE PLAIN GUT 2.0 ETHICON (SUTURE) IMPLANT
TAPE PAPER 1X10 WHT MICROPORE (GAUZE/BANDAGES/DRESSINGS) IMPLANT
TOWEL OR 17X24 6PK STRL BLUE (TOWEL DISPOSABLE) ×1 IMPLANT
TRAY FOLEY W/BAG SLVR 14FR LF (SET/KITS/TRAYS/PACK) ×1 IMPLANT
WATER STERILE IRR 1000ML POUR (IV SOLUTION) ×1 IMPLANT

## 2023-08-08 NOTE — OR Nursing (Signed)
 Bladder Milking done in the OR at request of Dr. Racheal Buddle and Dr. Adriana Hopping   1000cc NS hung with 50cc formula hung attached to foley catheter.  Foley clamped and infused 50cc of milk irrigation into bladder.  At request of Dr. Racheal Buddle and Adriana Hopping infusion was stopped and foley was unclamped to drain.

## 2023-08-08 NOTE — Op Note (Signed)
 [name] PROCEDURE DATE: 08/08/2023  PREOPERATIVE DIAGNOSES: Intrauterine pregnancy at [redacted]w[redacted]d weeks gestation; previous uterine incision previous LTCS  POSTOPERATIVE DIAGNOSES: The same with possible fetal bradycardia  PROCEDURE: Repeat Low Transverse Cesarean Section  SURGEON:  Dr. Avie Boeck  ASSISTANT:  Maryclare Smoke , MD  ANESTHESIOLOGY TEAM: Anesthesiologist: Willian Harrow, MD CRNA: Victoriano Grate, CRNA  An experienced assistant was required given the standard of surgical care given the complexity of the case.  This assistant was needed for exposure, dissection, suctioning, retraction, instrument exchange,  assisting with delivery with administration of fundal pressure, and for overall help during the procedure.  INDICATIONS: Priscilla Phillips is a 28 y.o. G3P1011 at [redacted]w[redacted]d here for cesarean section secondary to the indications listed under preoperative diagnoses.  In MAU the patient was listed at 1 cm dilated.  Pt previously had discussed desire for repeat c section.  Labs were obtained and pt was scheduled for repeat cesarean section.  While awaiting labs, pt continued to contract.  Shortly before entering the OR staff informed the team that pt had SROM and felt the urge to push.  Pt was reassessed and was found to be completely dilated.  Pt had already received a spinal, but we quickly discussed VBAC. Pt agreed for TOLAC.  Prior to moving the patient off the table we attempted to reacquire fetal heartbeat.  I could not acquire the heart beat with the fetal monitor or with doppler.  Mobile ultrasound was moved to the OR, but due to the amount of time, decision was made for crash cesarean section.    The risks of surgery were discussed with the patient including but were not limited to: bleeding which may require transfusion or reoperation; infection which may require antibiotics; injury to bowel, bladder, ureters or other surrounding organs; injury to the fetus; need for  additional procedures including hysterectomy in the event of a life-threatening hemorrhage; formation of adhesions; placental abnormalities wth subsequent pregnancies; incisional problems; thromboembolic phenomenon and other postoperative/anesthesia complications.  The patient concurred with the proposed plan, giving informed written consent for the procedure.    FINDINGS:  Viable female infant in cephalic presentation.  Apgars 7 and 9.  Clear amniotic fluid.  Intact placenta, three vessel cord.  Normal uterus, fallopian tubes and ovaries bilaterally.  ANESTHESIA: Spinal INTRAVENOUS FLUIDS: 1600 ml   ESTIMATED BLOOD LOSS: 503 ml URINE OUTPUT:  150 ml SPECIMENS: Placenta sent to pathology COMPLICATIONS: None immediate  PROCEDURE IN DETAIL:  The patient preoperatively received intravenous antibiotics and had sequential compression devices applied to her lower extremities.  She was then taken to the operating room where spinal anesthesia was administered in sterile fashion and was found to be adequate. Once fetal heartbeat could not be obtained a splash prep of betadine was painted on the abdomen.  She was then placed in a dorsal supine position with a leftward tilt, and prepped and draped in a sterile manner.    After an adequate timeout was performed, a Pfannenstiel skin incision was made with scalpel two fingerbreaths above the pubic symphysis and carried through to the underlying layer of fascia. The fascia was incised in the midline, and this incision was extended bilaterally using the Mayo scissors.  The rectus muscles were separated in the midline and the peritoneum was entered bluntly. The bladder blade was introduced into the abdominal cavity.    Attention was turned to the lower uterine segment where a low transverse hysterotomy was made with a scalpel and extended  bilaterally bluntly.  The infant was successfully delivered, the cord was clamped and cut immediately due to worrisome tone, and the  infant was handed over to the awaiting neonatology team. Uterine massage was then administered, and the placenta delivered intact with a three-vessel cord.  Cord gases were previously taken. The uterus was then cleared of clots and debris using manual curettage.  The uterine incision was closed with 1-0 monocryl in a running locked fashion.  Figure-of-eight 1-0 monocryl  serosal stitches were placed to help with hemostasis.  The pelvis was cleared of all clot and debris with irrigation and suction. Hemostasis was confirmed on all surfaces. There was some concern regarding the bladder due to bloody urine, so sterile milk was infused and the bladder was found to be intact. The retractor was removed.  The peritoneum was closed with a 2-0 Vicryl running stitch The fascia was then closed using 0 Vicryl in a running fashion.  The subcutaneous layer was irrigated, reapproximated with 2-0 plain gut interrupted stitches, and the skin was closed with a 4-0 Vicryl subcuticular stitch. The patient tolerated the procedure well. Sponge, instrument and needle counts were correct x 3.  She was taken to the recovery room in stable condition.    Avie Boeck, MD, FACOG Obstetrician & Gynecologist, [facility] for [facility], [facility]

## 2023-08-08 NOTE — Anesthesia Postprocedure Evaluation (Signed)
 Anesthesia Post Note  Patient: Priscilla Phillips  Procedure(s) Performed: CESAREAN DELIVERY (Abdomen)     Patient location during evaluation: PACU Anesthesia Type: Spinal Level of consciousness: oriented and awake and alert Pain management: pain level controlled Vital Signs Assessment: post-procedure vital signs reviewed and stable Respiratory status: spontaneous breathing, respiratory function stable and patient connected to nasal cannula oxygen Cardiovascular status: blood pressure returned to baseline and stable Postop Assessment: no headache, no backache and no apparent nausea or vomiting Anesthetic complications: no  No notable events documented.  Last Vitals:  Vitals:   08/08/23 0945 08/08/23 1000  BP:  (!) 92/58  Pulse: 90 84  Resp: 18 19  Temp:  (!) 36.2 C  SpO2: 95% 96%    Last Pain:  Vitals:   08/08/23 1012  TempSrc:   PainSc: 5    Pain Goal:    LLE Motor Response: Purposeful movement (08/08/23 1000) LLE Sensation: Increased (08/08/23 1000) RLE Motor Response: Purposeful movement (08/08/23 1000) RLE Sensation: Increased (08/08/23 1000)     Epidural/Spinal Function Cutaneous sensation: Able to Discern Pressure (08/08/23 1000), Patient able to flex knees: Yes (08/08/23 1000), Patient able to lift hips off bed: No (08/08/23 1000), Back pain beyond tenderness at insertion site: No (08/08/23 1000), Progressively worsening motor and/or sensory loss: No (08/08/23 1000), Bowel and/or bladder incontinence post epidural: No (08/08/23 1000)  Willian Harrow

## 2023-08-08 NOTE — H&P (Signed)
 Obstetric Preoperative History and Physical  Priscilla Phillips is a 28 y.o. G3P1011 with IUP at [redacted]w[redacted]d presenting for scheduled cesarean section.  Reports good fetal movement, no bleeding, no contractions, no leaking of fluid.  No acute preoperative concerns.    Cesarean Section Indication: elective repeat cesarean delivery, latent labor, non-reassuring fetal tracing  Prenatal Course Source of Care: [place] with onset of care at 12 weeks Pregnancy complications or risks: Patient Active Problem List   Diagnosis Date Noted   [redacted] weeks gestation of pregnancy 08/08/2023   GDM, class A1 06/29/2023   Carrier of spinal muscular atrophy 02/14/2023   Previous cesarean delivery affecting pregnancy, antepartum 01/20/2023   Supervision of high risk pregnancy, antepartum 01/20/2023   She plans to breast and formula feed She desires Depo-Provera for postpartum contraception.   Prenatal labs and studies: ABO, Rh: --/--/PENDING (05/13 0545) Antibody: PENDING (05/13 0545) Rubella: 1.52 (11/08 1124) RPR: Non Reactive (02/27 0821)  HBsAg: Negative (11/08 1124)  HIV: Non Reactive (02/27 0821)  ZOX:WRUEAVWU/-- (04/22 1440) 2 hr Glucola abnl Genetic screening normal Anatomy US  abnormal with carrier for spinal muscular atrophy  Prenatal Transfer Tool  Maternal Diabetes: Yes:  Diabetes Type:  Diet controlled Genetic Screening: Normal Maternal Ultrasounds/Referrals: Normal Fetal Ultrasounds or other Referrals:  None Maternal Substance Abuse:  No Significant Maternal Medications:  None Significant Maternal Lab Results: Group B Strep negative  Past Medical History:  Diagnosis Date   Anxiety    Gestational diabetes    Ovarian cyst    Trichomonas infection    UTI (urinary tract infection)     Past Surgical History:  Procedure Laterality Date   CESAREAN SECTION N/A 08/09/2021   Procedure: CESAREAN SECTION;  Surgeon: Othelia Blinks, MD;  Location: [facility] LD ORS;  Service: Obstetrics;   Laterality: N/A;    OB History  Gravida Para Term Preterm AB Living  3 1 1  1 1   SAB IAB Ectopic Multiple Live Births  1   0 1    # Outcome Date GA Lbr Len/2nd Weight Sex Type Anes PTL Lv  3 Current           2 Term 08/09/21 [redacted]w[redacted]d  2750 g M CS-LTranv EPI  LIV  1 SAB 05/2018 106w0d           Social History   Socioeconomic History   Marital status: Single    Spouse name: Lyell Samuel   Number of children: 0   Years of education: Not on file   Highest education level: GED or equivalent  Occupational History   Occupation: Clinical biochemist  Tobacco Use   Smoking status: Never   Smokeless tobacco: Never  Vaping Use   Vaping status: Former   Substances: Nicotine, Flavoring  Substance and Sexual Activity   Alcohol use: Not Currently    Comment: occ   Drug use: Not Currently   Sexual activity: Yes    Birth control/protection: None  Other Topics Concern   Not on file  Social History Narrative   Not on file   Social Drivers of Health   Financial Resource Strain: Low Risk  (12/23/2022)   Overall Financial Resource Strain (CARDIA)    Difficulty of Paying Living Expenses: Not very hard  Food Insecurity: No Food Insecurity (06/07/2023)   Hunger Vital Sign    Worried About Running Out of Food in the Last Year: Never true    Ran Out of Food in the Last Year: Never true  Transportation Needs: No Transportation  Needs (12/23/2022)   PRAPARE - Administrator, Civil Service (Medical): No    Lack of Transportation (Non-Medical): No  Physical Activity: Insufficiently Active (12/23/2022)   Exercise Vital Sign    Days of Exercise per Week: 2 days    Minutes of Exercise per Session: 60 min  Stress: No Stress Concern Present (12/23/2022)   [employer] of Occupational Health - Occupational Stress Questionnaire    Feeling of Stress : Only a little  Social Connections: Moderately Isolated (12/23/2022)   Social Connection and Isolation Panel [NHANES]    Frequency of Communication  with Friends and Family: More than three times a week    Frequency of Social Gatherings with Friends and Family: Once a week    Attends Religious Services: Never    Database administrator or Organizations: No    Attends Engineer, structural: Not on file    Marital Status: Living with partner    Family History  Problem Relation Age of Onset   Cancer Mother        skin   Healthy Father    Diabetes Maternal Grandmother    Hypertension Maternal Grandmother    Asthma Neg Hx    Heart disease Neg Hx     Medications Prior to Admission  Medication Sig Dispense Refill Last Dose/Taking   Accu-Chek Softclix Lancets lancets DX:O24.419. Check blood sugar four times a day. 100 each 12    Blood Glucose Monitoring Suppl (ACCU-CHEK GUIDE) w/Device KIT DX:O24.419. Check blood sugar four times a day. 1 kit 0    glucose blood (ACCU-CHEK GUIDE TEST) test strip DX:O24.419. Check blood sugar four times a day. 100 each 12    Prenatal 28-0.8 MG TABS Take 1 tablet by mouth daily. 30 tablet 12 08/06/2023    No Known Allergies  Review of Systems: Pertinent items noted in HPI and remainder of comprehensive ROS otherwise negative.  Physical Exam: BP 128/71 (BP Location: Right Arm)   Pulse 87   Temp 97.7 F (36.5 C) (Oral)   Resp 16   Ht 4\' 10"  (1.473 m)   Wt 73.8 kg   LMP 11/09/2022   SpO2 99%   BMI 34.00 kg/m  FHR by Doppler: 155/mod/+a/+previously having late decels, improved w fluids CONSTITUTIONAL: Well-developed, well-nourished female in no acute distress.  HENT:  Normocephalic, atraumatic, External right and left ear normal. Oropharynx is clear and moist EYES: Conjunctivae and EOM are normal. Pupils are equal, round, and reactive to light. No scleral icterus.  NECK: Normal range of motion, supple, no masses SKIN: Skin is warm and dry. No rash noted. Not diaphoretic. No erythema. No pallor. NEUROLOGIC: Alert and oriented to person, place, and time. Normal reflexes, muscle tone  coordination. No cranial nerve deficit noted. PSYCHIATRIC: Normal mood and affect. Normal behavior. Normal judgment and thought content. CARDIOVASCULAR: Normal heart rate noted, regular rhythm RESPIRATORY: Effort and breath sounds normal, no problems with respiration noted ABDOMEN: Soft, nontender, nondistended, gravid. Well-healed Pfannenstiel incision. PELVIC: Deferred MUSCULOSKELETAL: Normal range of motion. No edema and no tenderness. 2+ distal pulses.  Pertinent Labs/Studies:   Results for orders placed or performed during the hospital encounter of 08/08/23 (from the past 72 hours)  CBC     Status: Abnormal   Collection Time: 08/08/23  5:45 AM  Result Value Ref Range   WBC 14.3 (H) 4.0 - 10.5 K/uL   RBC 4.01 3.87 - 5.11 MIL/uL   Hemoglobin 11.9 (L) 12.0 - 15.0 g/dL   HCT  35.1 (L) 36.0 - 46.0 %   MCV 87.5 80.0 - 100.0 fL   MCH 29.7 26.0 - 34.0 pg   MCHC 33.9 30.0 - 36.0 g/dL   RDW 16.1 09.6 - 04.5 %   Platelets 176 150 - 400 K/uL   nRBC 0.0 0.0 - 0.2 %    Comment: Performed at [facility], [address], [place], [place] [address]  Type and screen [facility]     Status: None (Preliminary result)   Collection Time: 08/08/23  5:45 AM  Result Value Ref Range   ABO/RH(D) PENDING    Antibody Screen PENDING    Sample Expiration      08/11/2023,2359 Performed at [facility], [address] [place], [place] [address]     Assessment and Plan: HASTI VAUGHNS is a 28 y.o. G3P1011 at [redacted]w[redacted]d being admitted for cesarean section in the setting of early labor, declines TOLAC, non-reassuring fetal tracing. The risks of surgery were discussed with the patient including but were not limited to: bleeding which may require transfusion or reoperation; infection which may require antibiotics; injury to bowel, bladder, ureters or other surrounding organs; injury to the fetus; need for additional procedures including hysterectomy in the event of a life-threatening  hemorrhage; formation of adhesions; placental abnormalities wth subsequent pregnancies; incisional problems; thromboembolic phenomenon and other postoperative/anesthesia complications. The patient concurred with the proposed plan, giving informed written consent for the procedure. Patient has been NPO since 9pm last night and she will remain NPO for procedure. Anesthesia and OR aware. Preoperative prophylactic antibiotics and SCDs ordered on call to the OR. To OR when ready.    Melanie Spires, MD FMOB Fellow, Faculty practice [facility], Center for [facility] 08/08/23  6:18 AM

## 2023-08-08 NOTE — Transfer of Care (Signed)
 Immediate Anesthesia Transfer of Care Note  Patient: Priscilla Phillips  Procedure(s) Performed: CESAREAN DELIVERY (Abdomen)  Patient Location: PACU  Anesthesia Type:Spinal  Level of Consciousness: awake  Airway & Oxygen Therapy: Patient Spontanous Breathing  Post-op Assessment: Report given to RN  Post vital signs: Reviewed and stable  Last Vitals:  Vitals Value Taken Time  BP    Temp    Pulse 90 08/08/23 0906  Resp    SpO2 98 % 08/08/23 0906  Vitals shown include unfiled device data.  Last Pain:  Vitals:   08/08/23 0622  TempSrc:   PainSc: 10-Worst pain ever         Complications: No notable events documented.

## 2023-08-08 NOTE — Anesthesia Procedure Notes (Signed)
 Spinal  Start time: 08/08/2023 7:36 AM End time: 08/08/2023 7:38 AM Reason for block: surgical anesthesia Staffing Performed: anesthesiologist  Anesthesiologist: Willian Harrow, MD Performed by: Willian Harrow, MD Authorized by: Willian Harrow, MD   Preanesthetic Checklist Completed: patient identified, IV checked, site marked, risks and benefits discussed, surgical consent, monitors and equipment checked, pre-op evaluation and timeout performed Spinal Block Patient position: sitting Prep: DuraPrep and site prepped and draped Location: L3-4 Injection technique: single-shot Needle Needle type: Pencan  Needle gauge: 24 G Needle length: 10 cm Needle insertion depth: 10 cm Additional Notes Patient tolerated well. No immediate complications.  Functioning IV was confirmed and monitors were applied. Sterile prep and drape, including hand hygiene and sterile gloves were used. The patient was positioned and the back was prepped. The skin was anesthetized with lidocaine . Free flow of clear CSF was obtained prior to injecting local anesthetic into the CSF. The spinal needle aspirated freely following injection. The needle was carefully withdrawn. The patient tolerated the procedure well.

## 2023-08-08 NOTE — Lactation Note (Signed)
 This note was copied from a baby's chart. Lactation Consultation Note  Patient Name: Priscilla Phillips [id number] Date: 08/08/2023 Age:30 hours Reason for consult: Initial assessment;Early term 44-38.6wks Mom watching baby get her bath. When mom got baby for STS she put her on the breast and latched great. Mom denies painful latch. Praised mom. Mom's feeding choice is BF/formula feeding. Encouraged to BF first then give formula if needed. Educated mom on colostrum consistency verses mature milk.  Newborn feeding habits, behavior, STS, I&O reviewed. Answered mom's questions. Mom encouraged to feed baby 8-12 times/24 hours and with feeding cues.  Encouraged mom to call for assistance or further questions.  Maternal Data Has patient been taught Hand Expression?: Yes Does the patient have breastfeeding experience prior to this delivery?: Yes How long did the patient breastfeed?: 2 weeks  Feeding    LATCH Score Latch: Grasps breast easily, tongue down, lips flanged, rhythmical sucking.  Audible Swallowing: None  Type of Nipple: Everted at rest and after stimulation  Comfort (Breast/Nipple): Soft / non-tender  Hold (Positioning): No assistance needed to correctly position infant at breast.  LATCH Score: 8   Lactation Tools Discussed/Used    Interventions Interventions: Breast feeding basics reviewed;Skin to skin;Hand express;Education;LC Services brochure  Discharge    Consult Status Consult Status: Follow-up Date: 08/09/23 Follow-up type: In-patient    Melanie Pellot G 08/08/2023, 8:49 PM

## 2023-08-08 NOTE — MAU Note (Signed)
 Pt says UC's strong since 10pm. PNC- office in [place] [facility] for rept C/S  08-11-2023

## 2023-08-08 NOTE — Discharge Summary (Signed)
 Postpartum Discharge Summary  Date of Service updated***     Patient Name: Priscilla Phillips DOB: 07/10/1995 MRN: [id number]  Date of admission: 08/08/2023 Delivery date:08/08/2023 Delivering provider: Abigail Abler Date of discharge: 08/08/2023  Admitting diagnosis: [redacted] weeks gestation of pregnancy [Z3A.38] Intrauterine pregnancy: [redacted]w[redacted]d     Secondary diagnosis:  Principal Problem:   [redacted] weeks gestation of pregnancy  Additional problems: ***    Discharge diagnosis: Term Pregnancy Delivered                                              Post partum procedures:{Postpartum procedures:23558} Augmentation: N/A Complications: {OB Labor/Delivery Complications:20784}  Hospital course: Onset of Labor With Unplanned C/S   28 y.o. yo G3P1011 at [redacted]w[redacted]d was admitted for latent labor in setting of history of cesarean delivery with planned repeat cesarean delivery. Patient arrived to [facility] in latent labor. Discussed option for TOLAC vs repeat cesarean delivery and patient opted for repeat cesarean delivery. She had rupture of membranes just prior to transport to OR. She had progressed quickly and was found to be complete and +1 station once in the OR. Patient was given option for TOLAC and consented to [facility]. Just prior to transport, attempted to obtain FHR, but unable to find FHR with FHR or Doppler. U/S brought into the room and unable to obtain FHR. At this point, underwent stat cesarean delivery. Delivery details as follows: Membrane Rupture Time/Date: 7:20 AM,08/08/2023  Delivery Method:C-Section, Low Transverse Operative Delivery:N/A Details of operation can be found in separate operative note. Patient had a postpartum course complicated by***.  She is ambulating,tolerating a regular diet, passing flatus, and urinating well.  Patient is discharged home in stable condition 08/08/23.  Newborn Data: Birth date:08/08/2023 Birth time:7:52 AM Gender:Female Living status:Living Apgars:7 ,9   Weight:3300 g  Magnesium Sulfate received: {Mag received:30440022} BMZ received: No Rhophylac:N/A MMR:N/A T-DaP:Given prenatally Flu: No RSV Vaccine received: No Transfusion:{Transfusion received:30440034}  Immunizations received: There is no immunization history for the selected administration types on file for this patient.  Physical exam  Vitals:   08/08/23 0444  BP: 128/71  Pulse: 87  Resp: 16  Temp: 97.7 F (36.5 C)  TempSrc: Oral  SpO2: 99%  Weight: 73.8 kg  Height: 4\' 10"  (1.473 m)   General: {Exam; general:21111117} Lochia: {Desc; appropriate/inappropriate:30686::"appropriate"} Uterine Fundus: {Desc; firm/soft:30687} Incision: {Exam; incision:21111123} DVT Evaluation: {Exam; WUJ:8119147} Labs: Lab Results  Component Value Date   WBC 14.3 (H) 08/08/2023   HGB 11.9 (L) 08/08/2023   HCT 35.1 (L) 08/08/2023   MCV 87.5 08/08/2023   PLT 176 08/08/2023      Latest Ref Rng & Units 03/19/2023    7:08 PM  CMP  Glucose 70 - 99 mg/dL 829   BUN 6 - 20 mg/dL 9   Creatinine 5.62 - 1.30 mg/dL 8.65   Sodium 784 - 696 mmol/L 134   Potassium 3.5 - 5.1 mmol/L 3.4   Chloride 98 - 111 mmol/L 105   CO2 22 - 32 mmol/L 21   Calcium 8.9 - 10.3 mg/dL 8.8   Total Protein 6.5 - 8.1 g/dL 6.9   Total Bilirubin <2.9 mg/dL 0.6   Alkaline Phos 38 - 126 U/L 41   AST 15 - 41 U/L 15   ALT 0 - 44 U/L 10    Edinburgh Score:    10/26/2021  11:07 AM  Edinburgh Postnatal Depression Scale Screening Tool  I have been able to laugh and see the funny side of things. 0  I have looked forward with enjoyment to things. 0  I have blamed myself unnecessarily when things went wrong. 0  I have been anxious or worried for no good reason. 1  I have felt scared or panicky for no good reason. 0  Things have been getting on top of me. 0  I have been so unhappy that I have had difficulty sleeping. 0  I have felt sad or miserable. 0  I have been so unhappy that I have been crying. 0  The  thought of harming myself has occurred to me. 0  Edinburgh Postnatal Depression Scale Total 1   No data recorded  After visit meds:  Allergies as of 08/08/2023   No Known Allergies   Med Rec must be completed prior to using this [facility]***        Discharge home in stable condition Infant Feeding: {Baby feeding:23562} Infant Disposition:{CHL IP OB HOME WITH ZOXWRU:04540} Discharge instruction: per After Visit Summary and Postpartum booklet. Activity: Advance as tolerated. Pelvic rest for 6 weeks.  Diet: {OB JWJX:91478295} Future Appointments: Future Appointments  Date Time Provider Department Center  08/17/2023  1:00 PM [facility] NURSE [facility] None  09/21/2023  3:10 PM Stinson, Jacob J, DO [facility] None  09/25/2023  2:00 PM Gavin Kast, FNP [facility]   Follow up Visit: Message sent to [facility] 5/13  Please schedule this patient for a In person postpartum visit in 6 weeks with the following provider: Any provider. Additional Postpartum F/U:2 hour GTT and Incision check 1 week  High risk pregnancy complicated by: GDM Delivery mode:  C-Section, Low Transverse Anticipated Birth Control:  Depo   08/08/2023 Melanie Spires, MD

## 2023-08-09 ENCOUNTER — Encounter (HOSPITAL_COMMUNITY)
Admission: RE | Admit: 2023-08-09 | Discharge: 2023-08-09 | Disposition: A | Discharge status: Home / Self Care | Source: Ambulatory Visit

## 2023-08-09 HISTORY — DX: Gestational diabetes mellitus in pregnancy, unspecified control: O24.419

## 2023-08-09 LAB — CBC
HCT: 28.4 % — ABNORMAL LOW (ref 36.0–46.0)
Hemoglobin: 9.4 g/dL — ABNORMAL LOW (ref 12.0–15.0)
MCH: 29.1 pg (ref 26.0–34.0)
MCHC: 33.1 g/dL (ref 30.0–36.0)
MCV: 87.9 fL (ref 80.0–100.0)
Platelets: 174 10*3/uL (ref 150–400)
RBC: 3.23 MIL/uL — ABNORMAL LOW (ref 3.87–5.11)
RDW: 13.1 % (ref 11.5–15.5)
WBC: 15.8 10*3/uL — ABNORMAL HIGH (ref 4.0–10.5)
nRBC: 0 % (ref 0.0–0.2)

## 2023-08-09 LAB — RPR: RPR Ser Ql: NONREACTIVE

## 2023-08-09 MED ORDER — FERROUS SULFATE 325 (65 FE) MG PO TABS
325.0000 mg | ORAL_TABLET | ORAL | Status: DC
  Administered 2023-08-09: 325 mg via ORAL
  Filled 2023-08-09: qty 1

## 2023-08-09 NOTE — Progress Notes (Signed)
 POSTPARTUM PROGRESS NOTE  POD #1  Subjective:  Priscilla Phillips is a 28 y.o. G9F6213 s/p pLTCS at [redacted]w[redacted]d.  No acute events overnight. She reports she is doing well. She denies any problems with ambulating, voiding or po intake. Denies nausea or vomiting. She has passed flatus. Pain is well controlled.  Lochia is like a period.  Objective: BP 106/79 (BP Location: Right Arm)   Pulse 83   Temp 97.8 F (36.6 C) (Oral)   Resp 17   Ht 4\' 10"  (1.473 m)   Wt 73.8 kg   LMP 11/09/2022   SpO2 100%   Breastfeeding Unknown   BMI 34.00 kg/m   Physical Exam:  General: alert, cooperative and no distress Chest: no respiratory distress Heart:regular rate, distal pulses intact Abdomen: soft, nontender,  Uterine Fundus: firm, appropriately tender DVT Evaluation: No calf swelling or tenderness Extremities: no LE edema Skin: warm, dry; incision clean/dry/intact w/ pressure dressing in place  Recent Labs    08/08/23 1136 08/09/23 0529  HGB 10.7* 9.4*  HCT 33.3* 28.4*    Assessment/Plan: Priscilla Phillips is a 28 y.o. Y8M5784 s/p pLTCS at [redacted]w[redacted]d for NRFHT.  POD#1 - Doing welll; pain well controlled. H/H appropriate  Routine postpartum care  OOB, ambulated  Lovenox  for VTE prophylaxis Anemia: asymptomatic, start PO iron every other day GDMA1: 2hr GTT at PP visit Contraception: depo shot, ordered Feeding: both  Dispo: Plan for discharge POD#2.   LOS: 1 day   Teena Feast, MD/MPH Attending Family Medicine Physician, [facility] for [facility], [facility]  08/09/2023, 2:57 PM

## 2023-08-09 NOTE — Patient Instructions (Signed)
 If interested in an outpatient lactation consult in office or virtually please reach out to us  at [facility] (First Floor) [address] [place] Please call [phone] and press 4 for lactation.    [name], [name]

## 2023-08-09 NOTE — Lactation Note (Signed)
 This note was copied from a baby's chart. Lactation Consultation Note  Patient Name: Priscilla Phillips ZOXWR'U Date: 08/09/2023 Age:30 hours Reason for consult: Follow-up assessment  Mother resting.  Lactation to follow up.  Provided volume guidelines and a Nfant standard nipple.   Feeding Mother's Current Feeding Choice: Breast Milk and Formula Consult Status Consult Status: Follow-up Date: 08/09/23    [name] [facility] 08/09/2023, 12:33 PM

## 2023-08-10 ENCOUNTER — Encounter: Admitting: Obstetrics and Gynecology

## 2023-08-10 LAB — SURGICAL PATHOLOGY

## 2023-08-10 MED ORDER — IBUPROFEN 600 MG PO TABS
600.0000 mg | ORAL_TABLET | Freq: Four times a day (QID) | ORAL | 0 refills | Status: AC | PRN
Start: 2023-08-10 — End: ?

## 2023-08-10 MED ORDER — FERROUS SULFATE 325 (65 FE) MG PO TABS
325.0000 mg | ORAL_TABLET | ORAL | 3 refills | Status: DC
Start: 2023-08-11 — End: 2023-09-21

## 2023-08-10 MED ORDER — OXYCODONE HCL 5 MG PO TABS
5.0000 mg | ORAL_TABLET | Freq: Four times a day (QID) | ORAL | 0 refills | Status: DC | PRN
Start: 2023-08-10 — End: 2023-09-21

## 2023-08-10 NOTE — Progress Notes (Signed)
 [name] was referred for history of anxiety.  * Referral screened out by Clinical Social Worker because none of the following criteria appear to apply:  ~ History of anxiety during this pregnancy, or of post-partum depression following prior delivery.  ~ Diagnosis of anxiety within last 3 years  Per OB notes, [name] did not indicate any signs/symptoms during her pregnancy. Anxiety 2021  OR  * [name]'s symptoms currently being treated with medication and/or therapy.  Please contact the Clinical Social Worker if needs arise, by [facility] request, or if [name] scores greater than 9/yes to question 10 on Edinburgh Postpartum Depression Screen.  Priscilla Phillips, Milinda Allen Clinical Social Worker [phone]

## 2023-08-10 NOTE — Lactation Note (Signed)
 This note was copied from a baby's chart. Lactation Consultation Note  Patient Name: Priscilla Phillips [id number] Date: 08/10/2023 Age:30 hours Reason for consult: Follow-up assessment;Early term 37-38.6wks  P2- [name] reports that infant latches "beautifully", but she has been supplementing with formula because she feels like she doesn't have enough milk. LC reviewed the difference between colostrum and mature milk. LC reviewed how we should expect her mature milk in around day 2-7 pp (on average). [name] asked how she should incorporate pumping into her breastfeeding schedule. LC reviewed why overstimulation isn't always the best idea because it could cause an oversupply (mastitis, breast abscesses, constant clogged ducts and constant engorgement.) LC encouraged [name] to continue latching infant to the breast with every feeding, and to pump after every other feeding for further stimulation if that is what she desires. [name] requested to have a manual pump. LC provided [name] with a manual pump, informed her that her flange size is an 18 mm, and reviewed where she can purchase flange inserts for her home pump. [name] thanked [facility] and denied having further questions or concerns. LC reviewed the CDC milk storage guidelines, LC services handout and engorgement/breast care. LC encouraged [name] to call for further assistance as needed.  Maternal Data Does the patient have breastfeeding experience prior to this delivery?: Yes How long did the patient breastfeed?: 2 weeks, [name] reports she had to stop breastfeeding because her first child had jaundice.  Feeding Mother's Current Feeding Choice: Breast Milk and Formula Nipple Type: Nfant Standard Flow (white)  Lactation Tools Discussed/Used Tools: Pump;Flanges Flange Size: 18 Breast pump type: Manual Pump Education: Setup, frequency, and cleaning;Milk Storage Reason for Pumping: [name] request Pumping frequency: 15-20 min every 3 hrs  Interventions Interventions: Breast  feeding basics reviewed;Hand pump;Education;LC Services brochure  Discharge Discharge Education: Engorgement and breast care;Warning signs for feeding baby Pump: DEBP;Manual;Personal  Consult Status Consult Status: Complete Date: 08/10/23    Vernette Goo BS, IBCLC 08/10/2023, 12:02 PM

## 2023-08-11 ENCOUNTER — Inpatient Hospital Stay (HOSPITAL_COMMUNITY): Payer: Self-pay | Admitting: Anesthesiology

## 2023-08-11 ENCOUNTER — Inpatient Hospital Stay (HOSPITAL_COMMUNITY): Admit: 2023-08-11 | Admitting: Obstetrics and Gynecology

## 2023-08-17 ENCOUNTER — Encounter: Admitting: Family Medicine

## 2023-08-17 ENCOUNTER — Ambulatory Visit

## 2023-08-23 ENCOUNTER — Ambulatory Visit (INDEPENDENT_AMBULATORY_CARE_PROVIDER_SITE_OTHER)

## 2023-08-23 NOTE — Progress Notes (Signed)
 Subjective:     Priscilla Phillips is a 28 y.o. female who presents to the clinic 2 weeks status post low uterine, transverse cesarean section. Pt reports incision is healing well.      Objective:    BP 130/64   Pulse 87   Ht 4\' 10"  (1.473 m)   Wt 141 lb (64 kg)   LMP 11/09/2022   BMI 29.47 kg/m  General:  alert, well appearing, in no apparent distress  Incision:   healing well, no drainage, no erythema, no hernia, no seroma, no swelling, no dehiscence, incision well approximated     Assessment:    Doing well postoperatively.   Plan:    1. Continue any current medications. 2. Wound care discussed. 3. Follow up: 09/21/23.   Arrie Lares, RN

## 2023-08-24 ENCOUNTER — Encounter: Payer: Self-pay | Admitting: Family Medicine

## 2023-08-25 MED ORDER — CYCLOBENZAPRINE HCL 10 MG PO TABS: 5.0000 mg | ORAL_TABLET | Freq: Three times a day (TID) | ORAL | 2 refills | Status: DC | PRN

## 2023-08-25 MED ORDER — DICLOFENAC SODIUM 75 MG PO TBEC: 75.0000 mg | DELAYED_RELEASE_TABLET | Freq: Two times a day (BID) | ORAL | 2 refills | Status: DC

## 2023-09-18 ENCOUNTER — Other Ambulatory Visit: Payer: Self-pay

## 2023-09-18 ENCOUNTER — Ambulatory Visit
Admission: EM | Admit: 2023-09-18 | Discharge: 2023-09-18 | Disposition: A | Discharge status: Home / Self Care | Attending: Family Medicine | Admitting: Family Medicine

## 2023-09-18 DIAGNOSIS — H1012 Acute atopic conjunctivitis, left eye: Secondary | ICD-10-CM | POA: Diagnosis not present

## 2023-09-18 MED ORDER — AZELASTINE HCL 0.05 % OP SOLN: 1.0000 [drp] | Freq: Two times a day (BID) | OPHTHALMIC | 0 refills | Status: DC

## 2023-09-18 NOTE — ED Provider Notes (Signed)
 [facility]    CSN: [id number] Arrival date & time: 09/18/23  1656      History   Chief Complaint Chief Complaint  Patient presents with   Eye Problem    HPI Priscilla Phillips is a 28 y.o. female presents for eye redness.  Patient reports 2 days of a waxing and waning left eye redness with some watery drainage.  Endorses foreign body sensation but denies blurry vision, eye pain, injury to the eye, or photophobia.  No URI or allergy symptoms.  Does not wear contacts or glasses.  Has been trying Clear Eyes OTC with minimal improvement.  No other concerns at this time.   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   Previous cesarean delivery affecting pregnancy, antepartum 01/20/2023   Supervision of high risk pregnancy, antepartum 01/20/2023    Past Surgical History:  Procedure Laterality Date   CESAREAN SECTION N/A 08/09/2021   Procedure: CESAREAN SECTION;  Surgeon: Lorence Ozell CROME, MD;  Location: [facility] LD ORS;  Service: Obstetrics;  Laterality: N/A;   CESAREAN SECTION N/A 08/08/2023   Procedure: CESAREAN DELIVERY;  Surgeon: Zina Jerilynn LABOR, MD;  Location: [facility] LD ORS;  Service: Obstetrics;  Laterality: N/A;    OB History     Gravida  3   Para  2   Term  2   Preterm      AB  1   Living  2      SAB  1   IAB      Ectopic      Multiple  0   Live Births  2            Home Medications    Prior to Admission medications   Medication Sig Start Date End Date Taking? Authorizing Provider  azelastine (OPTIVAR) 0.05 % ophthalmic solution Place 1 drop into the left eye 2 (two) times daily. 09/18/23  Yes Mariyanna Mucha, Jodi R, NP  cyclobenzaprine  (FLEXERIL ) 10 MG tablet  Take 0.5-1 tablets (5-10 mg total) by mouth 3 (three) times daily as needed for muscle spasms. 08/25/23   Stinson, Jacob J, DO  diclofenac  (VOLTAREN ) 75 MG EC tablet Take 1 tablet (75 mg total) by mouth 2 (two) times daily with a meal. 08/25/23   Stinson, Jacob J, DO  ferrous sulfate  325 (65 FE) MG tablet Take 1 tablet (325 mg total) by mouth every other day. Patient not taking: Reported on 08/23/2023 08/11/23   Milly Olam LABOR, CNM  ibuprofen  (ADVIL ) 600 MG tablet Take 1 tablet (600 mg total) by mouth every 6 (six) hours as needed. 08/10/23   Leftwich-Kirby, Olam LABOR, CNM  oxyCODONE  (OXY IR/ROXICODONE ) 5 MG immediate release tablet Take 1-2 tablets (5-10 mg total) by mouth every 6 (six) hours as needed for moderate pain (pain score 4-6). 08/10/23   Leftwich-Kirby, Olam LABOR, CNM  Prenatal 28-0.8 MG TABS Take 1 tablet by mouth daily. 03/09/23   Stinson, Jacob J, DO    Family History Family History  Problem Relation Age of Onset   Cancer Mother        skin   Healthy Father    Diabetes  Maternal Grandmother    Hypertension Maternal Grandmother    Asthma Neg Hx    Heart disease Neg Hx     Social History Social History   Tobacco Use   Smoking status: Never   Smokeless tobacco: Never  Vaping Use   Vaping status: Former   Substances: Nicotine, Flavoring  Substance Use Topics   Alcohol use: Not Currently    Comment: occ   Drug use: Not Currently     Allergies   Patient has no known allergies.   Review of Systems Review of Systems  Eyes:        Left eye redness     Physical Exam Triage Vital Signs ED Triage Vitals  Encounter Vitals Group     BP 09/18/23 1706 111/65     Girls Systolic BP Percentile --      Girls Diastolic BP Percentile --      Boys Systolic BP Percentile --      Boys Diastolic BP Percentile --      Pulse Rate 09/18/23 1706 67     Resp 09/18/23 1706 16     Temp 09/18/23 1706 97.9 F (36.6 C)     Temp Source 09/18/23 1706 Oral     SpO2 09/18/23 1706 96 %      Weight --      Height --      Head Circumference --      Peak Flow --      Pain Score 09/18/23 1704 0     Pain Loc --      Pain Education --      Exclude from Growth Chart --    No data found.  Updated Vital Signs BP 111/65   Pulse 67   Temp 97.9 F (36.6 C) (Oral)   Resp 16   LMP 08/16/2023   SpO2 96%   Breastfeeding No   Visual Acuity Right Eye Distance: 20/25 Left Eye Distance: 20/25 Bilateral Distance: 20/20  Right Eye Near:   Left Eye Near:    Bilateral Near:     Physical Exam Vitals and nursing note reviewed.  Constitutional:      General: She is not in acute distress.    Appearance: Normal appearance. She is not ill-appearing.  HENT:     Head: Normocephalic and atraumatic.   Eyes:     General: Lids are normal.        Left eye: No foreign body, discharge or hordeolum.     Extraocular Movements: Extraocular movements intact.     Conjunctiva/sclera:     Left eye: Left conjunctiva is injected. No chemosis, exudate or hemorrhage.    Pupils: Pupils are equal, round, and reactive to light.     Left eye: No corneal abrasion or fluorescein uptake.     Comments: Very mild injection of the inner aspect of the left conjunctiva.  Mild watery drainage noted.   Cardiovascular:     Rate and Rhythm: Normal rate.  Pulmonary:     Effort: Pulmonary effort is normal.   Skin:    General: Skin is warm and dry.   Neurological:     General: No focal deficit present.     Mental Status: She is alert and oriented to person, place, and time.   Psychiatric:        Mood and Affect: Mood normal.        Behavior: Behavior normal.      Visual Acuity Visual Acuity Bilateral Distance: 20/20R Distance: 20/25L  Distance: 20/25   UC Treatments / Results  Labs (all labs ordered are listed, but only abnormal results are displayed) Labs Reviewed - No data to display  EKG   Radiology No results found.  Procedures Procedures (including critical care  time)  Medications Ordered in UC Medications - No data to display  Initial Impression / Assessment and Plan / UC Course  I have reviewed the triage vital signs and the nursing notes.  Pertinent labs & imaging results that were available during my care of the patient were reviewed by me and considered in my medical decision making (see chart for details).     Reviewed exam and symptoms with patient.  No red flags.  Negative fluorescein uptake.  No foreign body on exam.  Visual acuity unremarkable.  Discussed allergic conjunctivitis.  Will start antihistamine eyedrops as prescribed.  Warm compresses as needed.  PCP neuro-ophthalmology follow-up as symptoms do not improve.  ER precautions reviewed. Final Clinical Impressions(s) / UC Diagnoses   Final diagnoses:  Allergic conjunctivitis of left eye     Discharge Instructions      Start antihistamine eye drops as prescribed.  Start over-the-counter allergy medicine such as Claritin or Zyrtec daily for at least 7 days.  Warm compresses as needed.  Follow-up with your PCP or eye doctor if symptoms improve.  Please go to the ER for any worsening symptoms.  Hope you feel better soon!    ED Prescriptions     Medication Sig Dispense Auth. Provider   azelastine (OPTIVAR) 0.05 % ophthalmic solution Place 1 drop into the left eye 2 (two) times daily. 6 mL Soumya Colson, Jodi R, NP      PDMP not reviewed this encounter.   Loreda Myla SAUNDERS, NP 09/18/23 1726

## 2023-09-18 NOTE — Discharge Instructions (Signed)
 Start antihistamine eye drops as prescribed.  Start over-the-counter allergy medicine such as Claritin or Zyrtec daily for at least 7 days.  Warm compresses as needed.  Follow-up with your PCP or eye doctor if symptoms improve.  Please go to the ER for any worsening symptoms.  Hope you feel better soon!

## 2023-09-18 NOTE — ED Triage Notes (Signed)
 Pt states she feels something on the inner corner of her left eyex2d. Pt states her eye was red. Pt states she tried to flush her eye out with water  and clear eyes, but it's not working. Pt denies vision changes. Pt's sclera is a little red on the inner part of left eye.

## 2023-09-21 ENCOUNTER — Ambulatory Visit: Admitting: Family Medicine

## 2023-09-21 MED ORDER — NORETHIN ACE-ETH ESTRAD-FE 1-20 MG-MCG(24) PO TABS: 1.0000 | ORAL_TABLET | Freq: Every day | ORAL | 3 refills | Status: DC

## 2023-09-21 NOTE — Progress Notes (Signed)
 Post Partum Visit Note  Priscilla Phillips is a 28 y.o. G43P2012 female who presents for a postpartum visit. She is 5 weeks postpartum following a repeat cesarean section.  I have fully reviewed the prenatal and intrapartum course. The delivery was at 38 gestational weeks.  Anesthesia: spinal. Postpartum course has been . Baby is doing well. Baby is feeding by bottle - Similac Total Comfort. Bleeding no bleeding. Bowel function is normal. Bladder function is normal. Patient is sexually active. Contraception method is OCP (estrogen/progesterone). Postpartum depression screening: negative.   The pregnancy intention screening data noted above was reviewed. Potential methods of contraception were discussed. The patient elected to proceed with No data recorded.   Edinburgh Postnatal Depression Scale - 09/21/23 1524       Edinburgh Postnatal Depression Scale:  In the Past 7 Days   I have been able to laugh and see the funny side of things. 0    I have looked forward with enjoyment to things. 0    I have blamed myself unnecessarily when things went wrong. 0    I have been anxious or worried for no good reason. 2    I have felt scared or panicky for no good reason. 0    Things have been getting on top of me. 2    I have been so unhappy that I have had difficulty sleeping. 0    I have felt sad or miserable. 0    I have been so unhappy that I have been crying. 0    The thought of harming myself has occurred to me. 0    Edinburgh Postnatal Depression Scale Total 4          Health Maintenance Due  Topic Date Due   DTaP/Tdap/Td (1 - Tdap) Never done   Hepatitis B Vaccines (1 of 3 - 19+ 3-dose series) Never done   HPV VACCINES (1 - 3-dose SCDM series) Never done   COVID-19 Vaccine (1 - 2024-25 season) Never done    The following portions of the patient's history were reviewed and updated as appropriate: allergies, current medications, past family history, past medical history, past social  history, past surgical history, and problem list.  Review of Systems Pertinent items are noted in HPI.  Objective:  BP 111/78 (BP Location: Left Arm, Patient Position: Sitting, Cuff Size: Normal)   Pulse 92   Wt 140 lb (63.5 kg)   LMP 08/16/2023   Breastfeeding No   BMI 29.26 kg/m    General:  alert, cooperative, and no distress   Breasts:  not indicated  Lungs: clear to auscultation bilaterally  Heart:  regular rate and rhythm, S1, S2 normal, no murmur, click, rub or gallop  Abdomen: soft, non-tender; bowel sounds normal; no masses,  no organomegaly   Wound well approximated incision  GU exam:  not indicated       Assessment:    1. Postpartum care and examination  Plan:   Essential components of care per ACOG recommendations:  1.  Mood and well being: Patient with negative depression screening today. Reviewed local resources for support.  - Patient tobacco use? No.   - hx of drug use? No.    2. Infant care and feeding:  -Patient currently breastmilk feeding? No.  -Social determinants of health (SDOH) reviewed in EPIC. No concerns  3. Sexuality, contraception and birth spacing - Patient does not want a pregnancy in the next year.   - Reviewed reproductive life planning.  Reviewed contraceptive methods based on pt preferences and effectiveness.  Patient desired Oral Contraceptive today.   - Discussed birth spacing of 18 months  4. Sleep and fatigue -Encouraged family/partner/community support of 4 hrs of uninterrupted sleep to help with mood and fatigue  5. Physical Recovery  - Discussed patients delivery and complications. She describes her labor as mixed. - Patient had a C-section. Patient expressed understanding - Patient has urinary incontinence? No. - Patient is safe to resume physical and sexual activity  6.  Health Maintenance - HM due items addressed Yes - Last pap smear  Diagnosis  Date Value Ref Range Status  02/03/2023   Final   - Negative for  intraepithelial lesion or malignancy (NILM)   Pap smear not done at today's visit.  -Breast Cancer screening indicated? No.   7. Chronic Disease/Pregnancy Condition follow up: None  - PCP follow up  Stanislaw Acton J Shandricka Monroy, DO Center for [facility], [facility]

## 2023-09-22 ENCOUNTER — Encounter: Payer: Self-pay | Admitting: Family Medicine

## 2023-09-25 ENCOUNTER — Ambulatory Visit: Payer: Medicaid Other | Admitting: Internal Medicine

## 2023-09-25 NOTE — Progress Notes (Deleted)
 [facility]IMARY CARE [facility] [address] [place] [address] Dept: [phone] Dept Fax: [phone]    Subjective:   Priscilla Phillips [birth year] 09/25/2023  No chief complaint on file.   HPI: Priscilla Phillips presents today for re-assessment and management of chronic medical conditions.  Discussed the use of AI scribe software for clinical note transcription with the patient, who gave verbal consent to proceed.  History of Present Illness        The following portions of the patient's history were reviewed and updated as appropriate: past medical history, past surgical history, family history, social history, allergies, medications, and problem list.   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   Past Medical History:  Diagnosis Date   Anxiety    Gestational diabetes    Ovarian cyst    Trichomonas infection    UTI (urinary tract infection)    Past Surgical History:  Procedure Laterality Date   CESAREAN SECTION N/A 08/09/2021   Procedure: CESAREAN SECTION;  Surgeon: Lorence Ozell CROME, MD;  [facility]: [facility];  Service: Obstetrics;  Laterality: N/A;   CESAREAN SECTION N/A 08/08/2023   Procedure: CESAREAN DELIVERY;  Surgeon: Zina Jerilynn LABOR, MD;  Location: [facility];  Service: Obstetrics;  Laterality: N/A;   Family History  Problem Relation Age of Onset   Cancer Mother        skin   Healthy Father    Diabetes Maternal Grandmother    Hypertension Maternal Grandmother    Asthma Neg Hx    Heart disease Neg Hx     Current Outpatient Medications:    ibuprofen  (ADVIL ) 600 MG tablet, Take 1 tablet (600 mg total) by mouth every 6 (six) hours as needed., Disp: 30 tablet, Rfl: 0   Norethindrone Acetate-Ethinyl Estrad-FE (LOESTRIN 24 FE) 1-20 MG-MCG(24) tablet, Take 1 tablet by mouth daily., Disp: 90 tablet, Rfl: 3   Prenatal 28-0.8 MG  TABS, Take 1 tablet by mouth daily., Disp: 30 tablet, Rfl: 12 No Known Allergies   ROS: A complete ROS was performed with pertinent positives/negatives noted in the HPI. The remainder of the ROS are negative.    Objective:   There were no vitals filed for this visit.  GENERAL: Well-appearing, in NAD. Well nourished.  SKIN: Pink, warm and dry. No rash, lesion, ulceration, or ecchymoses.  NECK: Trachea midline. Full ROM w/o pain or tenderness. No lymphadenopathy.  RESPIRATORY: Chest wall symmetrical. Respirations even and non-labored. Breath sounds clear to auscultation bilaterally.  CARDIAC: S1, S2 present, regular rate and rhythm. Peripheral pulses 2+ bilaterally.  MSK: Muscle tone and strength appropriate for age. Joints w/o tenderness, redness, or swelling.  EXTREMITIES: Without clubbing, cyanosis, or edema.  NEUROLOGIC: No motor or sensory deficits. Steady, even gait.  PSYCH/MENTAL STATUS: Alert, oriented x 3. Cooperative, appropriate mood and affect.   Health Maintenance Due  Topic Date Due   DTaP/Tdap/Td (1 - Tdap) Never done   Hepatitis B Vaccines (1 of 3 - 19+ 3-dose series) Never done   HPV VACCINES (1 - 3-dose SCDM series) Never done   COVID-19 Vaccine (1 - 2024-25 season) Never done    No results found for any visits on 09/25/23.  The ASCVD Risk score (Arnett DK, et al., 2019) failed to calculate for the following reasons:   The 2019 ASCVD risk score is only valid for ages 61 to 80  Assessment & Plan:  Assessment and Plan Assessment & Plan      Postpartum care and examination   No orders of the defined types were placed in this encounter.  No images are attached to the encounter or orders placed in the encounter. No orders of the defined types were placed in this encounter.   No follow-ups on file.   Rosina Senters, FNP

## 2023-10-16 ENCOUNTER — Telehealth: Payer: Self-pay | Admitting: *Deleted

## 2023-10-16 NOTE — Telephone Encounter (Signed)
 11/25/2022 no show 09/25/2023 no show  Final warning sent via mail and mychart

## 2023-10-17 NOTE — Telephone Encounter (Signed)
 Provider aware

## 2023-10-31 ENCOUNTER — Telehealth: Payer: Self-pay | Admitting: Internal Medicine

## 2023-10-31 ENCOUNTER — Encounter: Payer: Self-pay | Admitting: Family Medicine

## 2023-10-31 ENCOUNTER — Ambulatory Visit: Admitting: Internal Medicine

## 2023-10-31 NOTE — Telephone Encounter (Signed)
 11/25/22 no show 09/25/23 no show 10/31/23 no show  Dismissal generated - sent via mail and mychart

## 2023-10-31 NOTE — Progress Notes (Deleted)
  [facility]ARE [facility] [address] [place] [address] Dept: [phone] Dept Fax: [phone]  Acute Care Office Visit  Subjective:   Priscilla Phillips [birth year] 10/31/2023  No chief complaint on file.   HPI:    The following portions of the patient's history were reviewed and updated as appropriate: past medical history, past surgical history, family history, social history, allergies, medications, and problem list.   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   Past Medical History:  Diagnosis Date   Anxiety    Gestational diabetes    Ovarian cyst    Trichomonas infection    UTI (urinary tract infection)    Past Surgical History:  Procedure Laterality Date   CESAREAN SECTION N/A 08/09/2021   Procedure: CESAREAN SECTION;  Surgeon: Lorence Ozell CROME, MD;  [facility];  Service: Obstetrics;  Laterality: N/A;   CESAREAN SECTION N/A 08/08/2023   Procedure: CESAREAN DELIVERY;  Surgeon: Zina Jerilynn LABOR, MD;  [facility];  Service: Obstetrics;  Laterality: N/A;   Family History  Problem Relation Age of Onset   Cancer Mother        skin   Healthy Father    Diabetes Maternal Grandmother    Hypertension Maternal Grandmother    Asthma Neg Hx    Heart disease Neg Hx     Current Outpatient Medications:    ibuprofen  (ADVIL ) 600 MG tablet, Take 1 tablet (600 mg total) by mouth every 6 (six) hours as needed., Disp: 30 tablet, Rfl: 0   Norethindrone Acetate-Ethinyl Estrad-FE (LOESTRIN 24 FE) 1-20 MG-MCG(24) tablet, Take 1 tablet by mouth daily., Disp: 90 tablet, Rfl: 3   Prenatal 28-0.8 MG TABS, Take 1 tablet by mouth daily., Disp: 30 tablet, Rfl: 12 No Known Allergies   ROS: A complete ROS was performed with pertinent positives/negatives noted in the HPI. The remainder of the ROS are negative.    Objective:   There were  no vitals filed for this visit.  GENERAL: Well-appearing, in NAD. Well nourished.  SKIN: Pink, warm and dry.  RESPIRATORY: Chest wall symmetrical. Respirations even and non-labored.  CARDIAC: S1, S2 present, regular rate and rhythm. Peripheral pulses 2+ bilaterally.  EXTREMITIES: Without clubbing, cyanosis, or edema.  NEUROLOGIC: Steady, even gait.  PSYCH/MENTAL STATUS: Alert, oriented x 3. Cooperative, appropriate mood and affect.    No results found for any visits on 10/31/23.    Assessment & Plan:   No orders of the defined types were placed in this encounter.  No orders of the defined types were placed in this encounter.  Lab Orders  No laboratory test(s) ordered today   No images are attached to the encounter or orders placed in the encounter.  No follow-ups on file.   Rosina Senters, FNP

## 2023-10-31 NOTE — Telephone Encounter (Signed)
 Copied from CRM [id number]. Topic: Appointments - Scheduling Inquiry for Clinic >> Oct 31, 2023  2:32 PM Henretta I wrote: Reason for CRM: Patient was dismissed from practice today due to missed appointments. Patient is juggling 2 kids and did not mean to miss these on purpose as she just got her times mixed up. Patient would like to know if Doctor is willing to reaccept her back into practice. Please Call patient back to further discuss.

## 2023-11-02 NOTE — Telephone Encounter (Signed)
 Pt requesting refill and asking if you will reconsider dismissal.

## 2023-11-03 ENCOUNTER — Encounter: Payer: Self-pay | Admitting: Internal Medicine

## 2023-11-03 NOTE — Telephone Encounter (Signed)
 That is fine, I can reaccept her.  If she misses her next appointment then she is dismissed.  However, I cannot refill her prescriptions until I see her in the office.

## 2023-11-07 ENCOUNTER — Ambulatory Visit: Admitting: Internal Medicine

## 2023-11-07 ENCOUNTER — Encounter: Payer: Self-pay | Admitting: Internal Medicine

## 2023-11-07 VITALS — BP 106/70 | HR 81 | Temp 98.6°F | Ht <= 58 in | Wt 136.0 lb

## 2023-11-07 DIAGNOSIS — Z308 Encounter for other contraceptive management: Secondary | ICD-10-CM | POA: Diagnosis not present

## 2023-11-07 DIAGNOSIS — F320 Major depressive disorder, single episode, mild: Secondary | ICD-10-CM | POA: Diagnosis not present

## 2023-11-07 DIAGNOSIS — F411 Generalized anxiety disorder: Secondary | ICD-10-CM | POA: Insufficient documentation

## 2023-11-07 LAB — POCT URINE PREGNANCY: Preg Test, Ur: NEGATIVE

## 2023-11-07 MED ORDER — MEDROXYPROGESTERONE ACETATE 150 MG/ML IM SUSP
150.0000 mg | Freq: Once | INTRAMUSCULAR | Status: AC
  Administered 2023-11-07 (×2): 150 mg via INTRAMUSCULAR

## 2023-11-07 MED ORDER — SERTRALINE HCL 25 MG PO TABS: 25.0000 mg | ORAL_TABLET | Freq: Every day | ORAL | 1 refills | Status: AC

## 2023-11-07 NOTE — Patient Instructions (Signed)
[facility] in [place], [place] [address] Urgent Care Services (ages 30 yo and up, available 24/7) Outpatient Counseling & Psychiatry (accepts people with no insurance, available during business hours)  [place] Medicaid  (* = Spanish available;  + = Psychiatric services) * [facility] in [place]:                                     [phone] or [phone] Virtual & Onsite  [facility]:                                              [place]:                                         [phone] Virtual & Onsite  * [facility]:                                                   [phone]   [facility]                                                    [phone] Virtual & Onsite  [facility]           [phone] Virtual   [facility]:                                                           [facility]:                                      [place]:                            [facility]                                                [facility] [place]:             [phone] or [phone]   *  [facility]                                                 [phone] Virtual & Onsite    Website to Find a Therapist:       [email or web address]://[email or web address].com/[email or web address]   Substance Use [place]:                                [phone]  Alcoholics Anonymous:      [phone]  Narcotics Anonymous:       [phone]  Quit Smoking Hotline:         800-QUIT-NOW (800-784-8669)    

## 2023-11-07 NOTE — Progress Notes (Signed)
 [facility]iscilla [facility] [address] [place] [address] Dept: [phone] Dept Fax: [phone]  Acute Care Office Visit  Subjective:   Priscilla Phillips June 06, 1995 11/07/2023  Chief Complaint  Patient presents with   Contraception   Anxiety    HPI:  Discussed the use of AI scribe software for clinical note transcription with the patient, who gave verbal consent to proceed.  History of Present Illness   Priscilla Phillips is a 28 year old female who presents with anxiety and emotional distress.  She feels overwhelmed and emotionally distressed, particularly in the last week. She describes a sensation of 'sinking' and a persistent urge to cry, which she tries to suppress, especially in front of her children. She attributes some of her emotional challenges to the demands of caring for two young children, both under five years old, one being a newborn. She noted that her mother was around a lot to help with her first child, but this time she is still around but not as present.   She has a history of anxiety medication use during high school, although she cannot recall the specific medication, only that it was a light green pill taken every morning. She suspects it might have been Zoloft  (sertraline ). No thoughts of self-harm or harm to others.  Regarding her menstrual cycle, she notes irregular bleeding, having had a period from July 27 to August 5, and then starting to bleed again two days prior to the visit. She is currently taking an oral contraceptive and is sexually active, with no missed contraceptive pills recently. She wants to switch from OCP's to depo-provera  injection.          11/07/2023   10:45 AM 06/07/2023    5:14 PM 02/03/2023   10:25 AM  Depression screen PHQ 2/9  Decreased Interest 0 0 0  Down, Depressed, Hopeless 1 0 0  PHQ - 2 Score 1 0 0  Altered sleeping 0  0  Tired, decreased energy 1  1  Change in appetite  0  0  Feeling bad or failure about yourself  0  0  Trouble concentrating 0  0  Moving slowly or fidgety/restless 0  0  Suicidal thoughts 0  0  PHQ-9 Score 2  1  Difficult doing work/chores Not difficult at all  Not difficult at all      11/07/2023   10:46 AM 02/03/2023   10:26 AM 01/20/2023   11:29 AM 10/03/2022   10:20 AM  GAD 7 : Generalized Anxiety Score  Nervous, Anxious, on Edge 2 0 0 2  Control/stop worrying 1 0 0 1  Worry too much - different things 1 0 0 1  Trouble relaxing 1 0 0 0  Restless 1 0 0 1  Easily annoyed or irritable 1 1 1 1   Afraid - awful might happen 0 0 0 0  Total GAD 7 Score 7 1 1 6   Anxiety Difficulty Not difficult at all Not difficult at all  Not difficult at all      The following portions of the patient's history were reviewed and updated as appropriate: past medical history, past surgical history, family history, social history, allergies, medications, and problem list.   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   Past Medical History:  Diagnosis Date   Anxiety    Gestational diabetes    Ovarian cyst    Trichomonas infection    UTI (urinary tract infection)    Past Surgical History:  Procedure Laterality Date   CESAREAN SECTION N/A 08/09/2021   Procedure: CESAREAN SECTION;  Surgeon: Lorence Ozell CROME, MD;  [facility];  Service: Obstetrics;  Laterality: N/A;   CESAREAN SECTION N/A 08/08/2023   Procedure: CESAREAN DELIVERY;  Surgeon: Zina Jerilynn LABOR, MD;  [facility]: [facility];  Service: Obstetrics;  Laterality: N/A;   Family History  Problem Relation Age of Onset   Cancer Mother        skin   Healthy Father    Diabetes Maternal Grandmother    Hypertension Maternal Grandmother    Asthma  Neg Hx    Heart disease Neg Hx     Current Outpatient Medications:    ibuprofen  (ADVIL ) 600 MG tablet, Take 1 tablet (600 mg total) by mouth every 6 (six) hours as needed., Disp: 30 tablet, Rfl: 0   Prenatal 28-0.8 MG TABS, Take 1 tablet by mouth daily., Disp: 30 tablet, Rfl: 12   sertraline  (ZOLOFT ) 25 MG tablet, Take 1 tablet (25 mg total) by mouth daily., Disp: 30 tablet, Rfl: 1 No Known Allergies   ROS: A complete ROS was performed with pertinent positives/negatives noted in the HPI. The remainder of the ROS are negative.    Objective:   Today's Vitals   11/07/23 1005  BP: 106/70  Pulse: 81  Temp: 98.6 F (37 C)  TempSrc: Temporal  SpO2: 98%  Weight: 136 lb (61.7 kg)  Height: 4' 10 (1.473 m)    GENERAL: Well-appearing, in NAD. Well nourished.  SKIN: Pink, warm and dry. No rash, lesion, ulceration, or ecchymoses.  NECK: Trachea midline. Full ROM w/o pain or tenderness. No lymphadenopathy.  RESPIRATORY: Chest wall symmetrical. Respirations even and non-labored. Breath sounds clear to auscultation bilaterally.  CARDIAC: S1, S2 present, regular rate and rhythm. Peripheral pulses 2+ bilaterally.  EXTREMITIES: Without clubbing, cyanosis, or edema.  NEUROLOGIC: No motor or sensory deficits. Steady, even gait.  PSYCH/MENTAL STATUS: Alert, oriented x 3. Cooperative, appropriate mood and affect.    Results for orders placed or performed in visit on 11/07/23  POCT urine pregnancy  Result Value Ref Range   Preg Test, Ur Negative Negative      Assessment & Plan:  Assessment and Plan    Anxiety and depression She reported feeling overwhelmed and emotional, with symptoms possibly linked to her menstrual cycle. Previously used anxiety medication in high school. - Start sertraline  (Zoloft ) 25mg  PO daily. Warned about potential side effects and risk of increased suicidal thoughts. - Follow up in six weeks to assess medication efficacy and dosage adjustment. She can follow up  sooner if needed.  - Provide resources for counseling.  Contraceptive management She wishes to switch to Depo-Provera  for convenience and reliability, with concerns about missing pills. Currently experiencing irregular bleeding. - POC pregnancy test negative - Administer Depo-Provera   - Advise using a backup contraceptive method for the first cycle/month after receiving the Depo shot.       Meds ordered this encounter  Medications   sertraline  (ZOLOFT ) 25 MG tablet    Sig: Take 1 tablet (25 mg total) by mouth daily.    Dispense:  30 tablet    Refill:  1    Supervising Provider:   THOMPSON, AARON B [[id number]]  medroxyPROGESTERone  (DEPO-PROVERA ) injection 150 mg   Orders Placed This Encounter  Procedures   POCT urine pregnancy   Lab Orders         POCT urine pregnancy     No images are attached to the encounter or orders placed in the encounter.  Return in about 6 weeks (around 12/19/2023) for Anxiety/Depression.   Rosina Senters, FNP

## 2023-11-13 ENCOUNTER — Ambulatory Visit: Admitting: Obstetrics and Gynecology

## 2023-12-19 ENCOUNTER — Ambulatory Visit: Admitting: Internal Medicine

## 2023-12-21 NOTE — Progress Notes (Deleted)
 [facility] [address] [place] [address] Dept: [phone] Dept Fax: [phone]  Acute Care Office Visit  Subjective:   Priscilla Phillips 1996/01/23 12/22/2023  No chief complaint on file.   HPI:      11/07/2023   10:45 AM 06/07/2023    5:14 PM 02/03/2023   10:25 AM  Depression screen PHQ 2/9  Decreased Interest 0 0 0  Down, Depressed, Hopeless 1 0 0  PHQ - 2 Score 1 0 0  Altered sleeping 0  0  Tired, decreased energy 1  1  Change in appetite 0  0  Feeling bad or failure about yourself  0  0  Trouble concentrating 0  0  Moving slowly or fidgety/restless 0  0  Suicidal thoughts 0  0  PHQ-9 Score 2  1  Difficult doing work/chores Not difficult at all  Not difficult at all      11/07/2023   10:46 AM 02/03/2023   10:26 AM 01/20/2023   11:29 AM 10/03/2022   10:20 AM  GAD 7 : Generalized Anxiety Score  Nervous, Anxious, on Edge 2 0 0 2  Control/stop worrying 1 0 0 1  Worry too much - different things 1 0 0 1  Trouble relaxing 1 0 0 0  Restless 1 0 0 1  Easily annoyed or irritable 1 1 1 1   Afraid - awful might happen 0 0 0 0  Total GAD 7 Score 7 1 1 6   Anxiety Difficulty Not difficult at all Not difficult at all  Not difficult at all       The following portions of the patient's history were reviewed and updated as appropriate: past medical history, past surgical history, family history, social history, allergies, medications, and problem list.   Patient Active Problem List   Diagnosis Date Noted   Generalized anxiety disorder 11/07/2023   Depression, major, single episode, mild 11/07/2023   Encounter for other contraceptive management 11/07/2023   GDM, class A1 06/29/2023   Carrier of spinal muscular atrophy 02/14/2023   Past Medical History:  Diagnosis Date   Anxiety    Gestational diabetes    Ovarian cyst    Trichomonas infection    UTI (urinary tract infection)    Past Surgical History:   Procedure Laterality Date   CESAREAN SECTION N/A 08/09/2021   Procedure: CESAREAN SECTION;  Surgeon: Lorence Ozell CROME, MD;  [facility];  Service: Obstetrics;  Laterality: N/A;   CESAREAN SECTION N/A 08/08/2023   Procedure: CESAREAN DELIVERY;  Surgeon: Zina Jerilynn LABOR, MD;  Location: [facility];  Service: Obstetrics;  Laterality: N/A;   Family History  Problem Relation Age of Onset   Cancer Mother        skin   Healthy Father    Diabetes Maternal Grandmother    Hypertension Maternal Grandmother    Asthma Neg Hx    Heart disease Neg Hx     Current Outpatient Medications:    ibuprofen  (ADVIL ) 600 MG tablet, Take 1 tablet (600 mg total) by mouth every 6 (six) hours as needed., Disp: 30 tablet, Rfl: 0   Prenatal 28-0.8 MG TABS, Take 1 tablet by mouth daily., Disp: 30 tablet, Rfl: 12   sertraline  (ZOLOFT ) 25 MG tablet, Take 1 tablet (25 mg total) by mouth daily., Disp: 30 tablet, Rfl: 1 No Known Allergies   ROS: A complete ROS was performed with pertinent positives/negatives noted in the HPI. The remainder of  the ROS are negative.    Objective:   There were no vitals filed for this visit.  GENERAL: Well-appearing, in NAD. Well nourished.  SKIN: Pink, warm and dry. No rash, lesion, ulceration, or ecchymoses.  NECK: Trachea midline. Full ROM w/o pain or tenderness. No lymphadenopathy.  RESPIRATORY: Chest wall symmetrical. Respirations even and non-labored. Breath sounds clear to auscultation bilaterally.  CARDIAC: S1, S2 present, regular rate and rhythm. Peripheral pulses 2+ bilaterally.  MSK: Muscle tone and strength appropriate for age. Joints w/o tenderness, redness, or swelling. EXTREMITIES: Without clubbing, cyanosis, or edema.  NEUROLOGIC: No motor or sensory deficits. Steady, even gait.  PSYCH/MENTAL STATUS: Alert, oriented x 3. Cooperative, appropriate mood and affect.    No results found for any visits on 12/22/23.    Assessment & Plan:     There are no  diagnoses linked to this encounter. No orders of the defined types were placed in this encounter.  No orders of the defined types were placed in this encounter.  Lab Orders  No laboratory test(s) ordered today   No images are attached to the encounter or orders placed in the encounter.  No follow-ups on file.   Rosina Senters, FNP

## 2023-12-22 ENCOUNTER — Ambulatory Visit: Admitting: Internal Medicine

## 2023-12-22 ENCOUNTER — Telehealth: Payer: Self-pay | Admitting: Internal Medicine

## 2023-12-22 DIAGNOSIS — F320 Major depressive disorder, single episode, mild: Secondary | ICD-10-CM

## 2023-12-22 DIAGNOSIS — F411 Generalized anxiety disorder: Secondary | ICD-10-CM

## 2023-12-22 NOTE — Telephone Encounter (Signed)
 See below. Do you want me to call her and cancel visit 9/29?

## 2023-12-22 NOTE — Telephone Encounter (Signed)
   I have called pt to notify of dismissal and cancellation of appt 12/25/2023. Pt understands to contact a new primary care office.  Generated dismissal letter. This is being sent via mail and mychart to the patient.

## 2023-12-22 NOTE — Telephone Encounter (Signed)
 Copied from CRM #[id number]. Topic: Appointments - Appointment Cancel/Reschedule >> Dec 22, 2023  8:54 AM Viola F wrote: Patient no showed appt this morning 12/22/23 due to her sons speech therapy running late, she rescheduled to 12/25/23 - she needs a call back to schedule depo shot. Please call [phone] (M)

## 2023-12-25 ENCOUNTER — Ambulatory Visit: Admitting: Internal Medicine
